# Patient Record
Sex: Female | Born: 1945 | Hispanic: No | Marital: Married | Smoking: Never smoker
Health system: Southern US, Community
[De-identification: ages and names within clinical notes are randomized; demographics above are authoritative.]

## PROBLEM LIST (undated history)

## (undated) DIAGNOSIS — F419 Anxiety disorder, unspecified: Secondary | ICD-10-CM

## (undated) DIAGNOSIS — E7800 Pure hypercholesterolemia, unspecified: Secondary | ICD-10-CM

## (undated) DIAGNOSIS — A159 Respiratory tuberculosis unspecified: Secondary | ICD-10-CM

## (undated) DIAGNOSIS — M1990 Unspecified osteoarthritis, unspecified site: Secondary | ICD-10-CM

## (undated) DIAGNOSIS — R7303 Prediabetes: Secondary | ICD-10-CM

## (undated) DIAGNOSIS — Z9889 Other specified postprocedural states: Secondary | ICD-10-CM

## (undated) DIAGNOSIS — I10 Essential (primary) hypertension: Secondary | ICD-10-CM

## (undated) DIAGNOSIS — G4730 Sleep apnea, unspecified: Secondary | ICD-10-CM

## (undated) DIAGNOSIS — D649 Anemia, unspecified: Secondary | ICD-10-CM

## (undated) HISTORY — PX: OTHER SURGICAL HISTORY: SHX169

## (undated) HISTORY — PX: BREAST CYST ASPIRATION: SHX578

## (undated) HISTORY — PX: EYE SURGERY: SHX253

---

## 1999-07-24 ENCOUNTER — Encounter: Admission: RE | Admit: 1999-07-24 | Discharge: 1999-07-24 | Payer: Self-pay | Admitting: Otolaryngology

## 1999-07-24 ENCOUNTER — Encounter: Payer: Self-pay | Admitting: Otolaryngology

## 1999-08-01 ENCOUNTER — Other Ambulatory Visit: Admission: RE | Admit: 1999-08-01 | Discharge: 1999-08-01 | Payer: Self-pay | Admitting: Otolaryngology

## 1999-08-22 ENCOUNTER — Ambulatory Visit (HOSPITAL_BASED_OUTPATIENT_CLINIC_OR_DEPARTMENT_OTHER): Admission: RE | Admit: 1999-08-22 | Discharge: 1999-08-22 | Payer: Self-pay | Admitting: Otolaryngology

## 1999-08-28 ENCOUNTER — Ambulatory Visit: Admission: RE | Admit: 1999-08-28 | Discharge: 1999-08-28 | Payer: Self-pay | Admitting: Otolaryngology

## 1999-08-30 ENCOUNTER — Encounter: Admission: RE | Admit: 1999-08-30 | Discharge: 1999-08-30 | Payer: Self-pay | Admitting: Otolaryngology

## 1999-08-30 ENCOUNTER — Encounter: Payer: Self-pay | Admitting: Otolaryngology

## 2000-12-04 ENCOUNTER — Other Ambulatory Visit: Admission: RE | Admit: 2000-12-04 | Discharge: 2000-12-04 | Payer: Self-pay | Admitting: Family Medicine

## 2003-07-27 ENCOUNTER — Encounter: Admission: RE | Admit: 2003-07-27 | Discharge: 2003-07-27 | Payer: Self-pay | Admitting: Family Medicine

## 2005-04-24 ENCOUNTER — Other Ambulatory Visit: Admission: RE | Admit: 2005-04-24 | Discharge: 2005-04-24 | Payer: Self-pay | Admitting: Family Medicine

## 2012-02-19 ENCOUNTER — Other Ambulatory Visit: Payer: Self-pay | Admitting: Family Medicine

## 2012-02-19 DIAGNOSIS — Z1231 Encounter for screening mammogram for malignant neoplasm of breast: Secondary | ICD-10-CM

## 2012-03-05 ENCOUNTER — Other Ambulatory Visit (HOSPITAL_COMMUNITY)
Admission: RE | Admit: 2012-03-05 | Discharge: 2012-03-05 | Disposition: A | Discharge status: Home / Self Care | Payer: Medicare Other | Source: Ambulatory Visit | Attending: Family Medicine | Admitting: Family Medicine

## 2012-03-05 ENCOUNTER — Other Ambulatory Visit: Payer: Self-pay | Admitting: Family Medicine

## 2012-03-05 DIAGNOSIS — Z1151 Encounter for screening for human papillomavirus (HPV): Secondary | ICD-10-CM | POA: Insufficient documentation

## 2012-03-05 DIAGNOSIS — Z124 Encounter for screening for malignant neoplasm of cervix: Secondary | ICD-10-CM | POA: Insufficient documentation

## 2012-03-27 ENCOUNTER — Emergency Department (HOSPITAL_BASED_OUTPATIENT_CLINIC_OR_DEPARTMENT_OTHER): Payer: Medicare Other

## 2012-03-27 ENCOUNTER — Encounter (HOSPITAL_BASED_OUTPATIENT_CLINIC_OR_DEPARTMENT_OTHER): Payer: Self-pay | Admitting: *Deleted

## 2012-03-27 ENCOUNTER — Emergency Department (HOSPITAL_BASED_OUTPATIENT_CLINIC_OR_DEPARTMENT_OTHER)
Admission: EM | Admit: 2012-03-27 | Discharge: 2012-03-28 | Disposition: A | Discharge status: Home / Self Care | Payer: Medicare Other | Attending: Emergency Medicine | Admitting: Emergency Medicine

## 2012-03-27 DIAGNOSIS — Y9269 Other specified industrial and construction area as the place of occurrence of the external cause: Secondary | ICD-10-CM | POA: Insufficient documentation

## 2012-03-27 DIAGNOSIS — W19XXXA Unspecified fall, initial encounter: Secondary | ICD-10-CM

## 2012-03-27 DIAGNOSIS — E7800 Pure hypercholesterolemia, unspecified: Secondary | ICD-10-CM | POA: Insufficient documentation

## 2012-03-27 DIAGNOSIS — IMO0002 Reserved for concepts with insufficient information to code with codable children: Secondary | ICD-10-CM | POA: Insufficient documentation

## 2012-03-27 DIAGNOSIS — M25462 Effusion, left knee: Secondary | ICD-10-CM

## 2012-03-27 DIAGNOSIS — W010XXA Fall on same level from slipping, tripping and stumbling without subsequent striking against object, initial encounter: Secondary | ICD-10-CM | POA: Insufficient documentation

## 2012-03-27 DIAGNOSIS — S8391XA Sprain of unspecified site of right knee, initial encounter: Secondary | ICD-10-CM

## 2012-03-27 DIAGNOSIS — I10 Essential (primary) hypertension: Secondary | ICD-10-CM | POA: Insufficient documentation

## 2012-03-27 DIAGNOSIS — S59909A Unspecified injury of unspecified elbow, initial encounter: Secondary | ICD-10-CM | POA: Insufficient documentation

## 2012-03-27 DIAGNOSIS — M25522 Pain in left elbow: Secondary | ICD-10-CM

## 2012-03-27 DIAGNOSIS — Y990 Civilian activity done for income or pay: Secondary | ICD-10-CM | POA: Insufficient documentation

## 2012-03-27 DIAGNOSIS — S6990XA Unspecified injury of unspecified wrist, hand and finger(s), initial encounter: Secondary | ICD-10-CM | POA: Insufficient documentation

## 2012-03-27 HISTORY — DX: Pure hypercholesterolemia, unspecified: E78.00

## 2012-03-27 HISTORY — DX: Essential (primary) hypertension: I10

## 2012-03-27 MED ORDER — OXYCODONE-ACETAMINOPHEN 5-325 MG PO TABS: 1.0000 | ORAL_TABLET | ORAL | Status: DC | PRN

## 2012-03-27 NOTE — ED Provider Notes (Signed)
History     CSN: [id number]  Arrival date & time 03/27/12  2109   First MD Initiated Contact with Patient 03/27/12 2115      Chief Complaint  Patient presents with  . Fall     Patient is a 66 y.o. female presenting with fall. The history is provided by the patient.  Fall The accident occurred 1 to 2 hours ago. The fall occurred while walking (she was at work and slipped on wet floor). The point of impact was the head, left knee, right knee and left elbow. The pain is present in the left knee, right knee and left elbow. The pain is mild. She was ambulatory at the scene. Pertinent negatives include no visual change, no fever, no numbness, no abdominal pain, no nausea, no vomiting, no headaches and no loss of consciousness. Exacerbated by: palpation. She has tried rest for the symptoms. The treatment provided mild relief.  Pt slipped at work and fell No LOC She hit head but no vomiting, no HA.  No weakness or visual disturbance is reported No cp/sob No abd pain She reports pain in both knees and left elbow  Past Medical History  Diagnosis Date  . Hypertension   . High cholesterol     Past Surgical History  Procedure Date  . C sections     No family history on file.  History  Substance Use Topics  . Smoking status: Never Smoker   . Smokeless tobacco: Not on file  . Alcohol Use: No    OB History    Grav Para Term Preterm Abortions TAB SAB Ect Mult Living                  Review of Systems  Constitutional: Negative for fever.  Respiratory: Negative for shortness of breath.   Cardiovascular: Negative for chest pain.  Gastrointestinal: Negative for nausea, vomiting and abdominal pain.  Neurological: Negative for loss of consciousness, weakness, numbness and headaches.  Psychiatric/Behavioral: Negative for agitation.    Allergies  Morphine and related  Home Medications  No current outpatient prescriptions on file.  BP 170/103  Pulse 112  Temp 98.7 F (37.1  C) (Oral)  Resp 20  SpO2 96%  Physical Exam  CONSTITUTIONAL: Well developed/well nourished HEAD AND FACE: Normocephalic/atraumatic EYES: EOMI/PERRL ENMT: Mucous membranes moist, No evidence of facial/nasal trauma NECK: supple no meningeal signs SPINE:entire spine nontender, NEXUS criteria met CV: S1/S2 noted, no murmurs/rubs/gallops noted Chest - nontender, no bruising noted LUNGS: Lungs are clear to auscultation bilaterally, no apparent distress ABDOMEN: soft, nontender, no rebound or guarding GU:no cva tenderness NEURO: Pt is awake/alert, moves all extremitiesx4, GCS 15 EXTREMITIES: pulses normal, full ROM.  Tenderness/swelling to left patella.  Tenderness to right patella.  No deformity to lower extremities.  She has tenderness to palpation and ROM of left elbow.  No deformity.  All other extremities/joints palpated/ranged and nontender. No injury to hands/wrists SKIN: warm, color normal PSYCH: no abnormalities of mood noted    ED Course  Procedures (including critical care time)  Labs Reviewed - No data to display Dg Elbow Complete Left  03/27/2012  *RADIOLOGY REPORT*  Clinical Data: Status post fall; posterior left elbow pain.  LEFT ELBOW - COMPLETE 3+ VIEW  Comparison: None.  Findings: There is no evidence of fracture or dislocation.  The visualized joint spaces are preserved.  No significant joint effusion is identified.  The soft tissues are unremarkable in appearance.  IMPRESSION: No evidence of fracture or dislocation.  Original Report Authenticated By: Tonia Ghent, M.D.    Dg Knee Complete 4 Views Left  03/27/2012  *RADIOLOGY REPORT*  Clinical Data: Status post fall; anterior left knee pain and swelling.  LEFT KNEE - COMPLETE 4+ VIEW  Comparison: None.  Findings: There is no evidence of fracture or dislocation.  The joint spaces are preserved.  Small marginal osteophytes are noted arising at the medial and patellofemoral compartments.  A small fabella is seen.  No  significant joint effusion is seen.  Prominent prepatellar soft tissue swelling is noted; this may reflect soft tissue injury, or prepatellar bursitis.  IMPRESSION:  1.  No evidence of fracture or dislocation. 2.  Mild osteoarthritis noted. 3.  Prominent prepatellar soft tissue swelling may reflect soft tissue injury or possibly prepatellar bursitis.   Original Report Authenticated By: Tonia Ghent, M.D.     Ace wrap provided by nursing Pt well appearing.  She can ambulate.  Stable for d/c     MDM  Nursing notes including past medical history and social history reviewed and considered in documentation xrays reviewed and considered         Joya Gaskins, MD 03/27/12 2354

## 2012-03-27 NOTE — ED Notes (Signed)
Fall. Slipped on a wet floor. Pain in her right knee. Swelling and deformity in her left knee. Pain her left elbow and left wrist. She hit her right ear but has not pain at this time.

## 2012-03-27 NOTE — ED Notes (Signed)
Patient transported to X-ray 

## 2012-03-28 ENCOUNTER — Ambulatory Visit
Admission: RE | Admit: 2012-03-28 | Discharge: 2012-03-28 | Disposition: A | Discharge status: Home / Self Care | Payer: Medicare Other | Source: Ambulatory Visit | Attending: Family Medicine | Admitting: Family Medicine

## 2012-03-28 DIAGNOSIS — Z1231 Encounter for screening mammogram for malignant neoplasm of breast: Secondary | ICD-10-CM

## 2012-04-01 ENCOUNTER — Encounter: Payer: Self-pay | Admitting: Family Medicine

## 2012-04-01 ENCOUNTER — Ambulatory Visit (INDEPENDENT_AMBULATORY_CARE_PROVIDER_SITE_OTHER): Payer: Medicare Other | Admitting: Family Medicine

## 2012-04-01 VITALS — BP 185/114 | HR 105 | Ht 61.0 in | Wt 178.0 lb

## 2012-04-01 DIAGNOSIS — S8990XA Unspecified injury of unspecified lower leg, initial encounter: Secondary | ICD-10-CM

## 2012-04-01 DIAGNOSIS — S8991XA Unspecified injury of right lower leg, initial encounter: Secondary | ICD-10-CM

## 2012-04-01 DIAGNOSIS — S8992XA Unspecified injury of left lower leg, initial encounter: Secondary | ICD-10-CM

## 2012-04-01 DIAGNOSIS — M25569 Pain in unspecified knee: Secondary | ICD-10-CM

## 2012-04-01 DIAGNOSIS — S99919A Unspecified injury of unspecified ankle, initial encounter: Secondary | ICD-10-CM

## 2012-04-01 DIAGNOSIS — M25561 Pain in right knee: Secondary | ICD-10-CM

## 2012-04-01 DIAGNOSIS — M25529 Pain in unspecified elbow: Secondary | ICD-10-CM

## 2012-04-01 MED ORDER — OXYCODONE-ACETAMINOPHEN 5-325 MG PO TABS: 1.0000 | ORAL_TABLET | Freq: Four times a day (QID) | ORAL | Status: DC | PRN

## 2012-04-01 MED ORDER — MELOXICAM 15 MG PO TABS: 15.0000 mg | ORAL_TABLET | Freq: Every day | ORAL | Status: DC

## 2012-04-01 NOTE — Patient Instructions (Addendum)
You suffered several contusions from the fall. The one area of concern is your right knee - you do have moderate underlying arthritis but may also have a meniscus tear as well. Start with meloxicam 15mg  daily with food for pain and inflammation. Icing 15 minutes at a time 3-4 times a day. ACE wrap right knee to help with swelling. Elevate above the level of your heart as much as possible. Follow up with me in 2 1/2 weeks. Consider cortisone injection, MRI if not improving as expected.

## 2012-04-03 ENCOUNTER — Encounter: Payer: Self-pay | Admitting: Family Medicine

## 2012-04-03 DIAGNOSIS — S8992XA Unspecified injury of left lower leg, initial encounter: Secondary | ICD-10-CM | POA: Insufficient documentation

## 2012-04-03 DIAGNOSIS — M25529 Pain in unspecified elbow: Secondary | ICD-10-CM | POA: Insufficient documentation

## 2012-04-03 DIAGNOSIS — S8991XA Unspecified injury of right lower leg, initial encounter: Secondary | ICD-10-CM | POA: Insufficient documentation

## 2012-04-03 NOTE — Progress Notes (Signed)
Subjective:    Patient ID: Tamara Odom, female    DOB: July 09, 1945, 66 y.o.   MRN: [id number]  PCP: [facility]  HPI 66 yo F here for bilateral knee, left elbow injuries.  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 On 12/5 she stepped on a wet spot on a floor while cleaning and slipped, landed directly on left knee, left elbow, and left wrist.   Believes she may have turned right knee. Also hit her head. Went to ED where she had radiographs - no fractures.  Mod arthritis right knee, mild left knee. Has been taking oxycodone, icing, and using ace wrap. Has been out of work since the injury. Left elbow has improved as well as head, right elbow (had some pain here).  Left wrist was sore but much improved now. Bruising to anterior left knee with some radiation up leg. Right knee is most painful currently - has prior h/o aspiration of this knee and known arthritis.  Past Medical History  Diagnosis Date  . Hypertension   . High cholesterol     Current Outpatient Prescriptions on File Prior to Visit  Medication Sig Dispense Refill  . CRESTOR 10 MG tablet       . hydrochlorothiazide (HYDRODIURIL) 25 MG tablet         Past Surgical History  Procedure Date  . C sections     Allergies  Allergen Reactions  . Morphine And Related     unknown    History   Social History  . Marital Status: Married    Spouse Name: N/A    Number of Children: N/A  . Years of Education: N/A   Occupational History  . Not on file.   Social History Main Topics  . Smoking status: Never Smoker   . Smokeless tobacco: Not on file  . Alcohol Use: No  . Drug Use: No  . Sexually Active: Not on file   Other Topics Concern  . Not on file   Social History Narrative  . No narrative on file    Family History  Problem Relation Age of Onset  . Heart attack Father   . Hypertension Father   . Diabetes Sister   . Hypertension Sister   . Hyperlipidemia Neg Hx    . Sudden death Neg Hx     BP 185/114  Pulse 105  Ht 5\' 1"  (1.549 m)  Wt 178 lb (80.74 kg)  BMI 33.63 kg/m2  Review of Systems See HPI above.    Objective:   Physical Exam Gen: NAD  Bilateral elbows: No gross deformity, swelling, bruising. No focal TTP. FROM. Collateral ligaments intact. Strength 5/5 flexion and extension.  L knee: Bruising overlying kneecap and inferior to this.  No other deformity, no effusion. TTP over bruised area.  No joint line or post patellar facet TTP. FROM. Negative ant/post drawers. Negative valgus/varus testing. Negative lachmanns. Negative mcmurrays, apleys, patellar apprehension, clarkes. NV intact distally.  R knee: Mild effusion.  No other deformity, bruising. TTP medial > lateral joint lines.  No post patellar or other TTP. ROM 0 - 120 degrees. Negative ant/post drawers. Negative valgus/varus testing. Negative lachmanns. Positive mcmurrays, apleys more medial than lateral. Negative patellar apprehension, clarkes. NV intact distally.     Assessment & Plan:  1. Right knee injury - of all injured areas, this is most painful and has persisted instead of improving.  Has underlying moderate DJD - believe she either flared this up or  caused a meniscal tear.  Will try to treat conservatively - would like to avoid injection for now.  Icing, meloxicam, ACE wrap, elevation.  F/u in 2 1/2 weeks.  Consider injection if not improving.  MRI would be a secondary consideration.  2. Elbow, left knee contusions - reassured.  Exams benign.  Meloxicam, icing as needed.  Should improve completely over next 2-3 weeks.

## 2012-04-03 NOTE — Assessment & Plan Note (Signed)
of all injured areas, this is most painful and has persisted instead of improving.  Has underlying moderate DJD - believe she either flared this up or caused a meniscal tear.  Will try to treat conservatively - would like to avoid injection for now.  Icing, meloxicam, ACE wrap, elevation.  F/u in 2 1/2 weeks.  Consider injection if not improving.  MRI would be a secondary consideration.

## 2012-04-03 NOTE — Assessment & Plan Note (Signed)
Contusion - reassured.  Exam benign.  Meloxicam, icing as needed.  Should improve completely over next 2-3 weeks. 

## 2012-04-03 NOTE — Assessment & Plan Note (Signed)
Contusion - reassured.  Exam benign.  Meloxicam, icing as needed.  Should improve completely over next 2-3 weeks.

## 2012-07-08 ENCOUNTER — Emergency Department (HOSPITAL_COMMUNITY)
Admission: EM | Admit: 2012-07-08 | Discharge: 2012-07-08 | Disposition: A | Discharge status: Home / Self Care | Payer: Medicare Other | Attending: Emergency Medicine | Admitting: Emergency Medicine

## 2012-07-08 DIAGNOSIS — R51 Headache: Secondary | ICD-10-CM | POA: Insufficient documentation

## 2012-07-08 DIAGNOSIS — IMO0002 Reserved for concepts with insufficient information to code with codable children: Secondary | ICD-10-CM | POA: Insufficient documentation

## 2012-07-08 DIAGNOSIS — Y9389 Activity, other specified: Secondary | ICD-10-CM | POA: Insufficient documentation

## 2012-07-08 DIAGNOSIS — Z862 Personal history of diseases of the blood and blood-forming organs and certain disorders involving the immune mechanism: Secondary | ICD-10-CM | POA: Insufficient documentation

## 2012-07-08 DIAGNOSIS — Z8639 Personal history of other endocrine, nutritional and metabolic disease: Secondary | ICD-10-CM | POA: Insufficient documentation

## 2012-07-08 DIAGNOSIS — I10 Essential (primary) hypertension: Secondary | ICD-10-CM | POA: Insufficient documentation

## 2012-07-08 DIAGNOSIS — S39012A Strain of muscle, fascia and tendon of lower back, initial encounter: Secondary | ICD-10-CM

## 2012-07-08 DIAGNOSIS — Z79899 Other long term (current) drug therapy: Secondary | ICD-10-CM | POA: Insufficient documentation

## 2012-07-08 MED ORDER — CYCLOBENZAPRINE HCL 10 MG PO TABS: 10.0000 mg | ORAL_TABLET | Freq: Two times a day (BID) | ORAL | Status: DC | PRN
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
## 2012-07-10 NOTE — ED Provider Notes (Signed)
  Medical screening examination/treatment/procedure(s) were performed by non-physician practitioner and as supervising physician I was immediately available for consultation/collaboration.    Gerhard Munch, MD 07/10/12 2337

## 2012-11-26 ENCOUNTER — Other Ambulatory Visit: Payer: Self-pay

## 2013-02-26 ENCOUNTER — Other Ambulatory Visit: Payer: Self-pay

## 2014-08-06 ENCOUNTER — Other Ambulatory Visit: Payer: Self-pay

## 2014-08-06 DIAGNOSIS — Z1231 Encounter for screening mammogram for malignant neoplasm of breast: Secondary | ICD-10-CM

## 2014-08-11 ENCOUNTER — Ambulatory Visit: Payer: Self-pay

## 2014-08-24 ENCOUNTER — Other Ambulatory Visit: Payer: Self-pay

## 2014-08-24 ENCOUNTER — Ambulatory Visit
Admission: RE | Admit: 2014-08-24 | Discharge: 2014-08-24 | Disposition: A | Discharge status: Home / Self Care | Payer: Medicare Other | Source: Ambulatory Visit

## 2014-08-24 DIAGNOSIS — Z1231 Encounter for screening mammogram for malignant neoplasm of breast: Secondary | ICD-10-CM

## 2015-10-19 ENCOUNTER — Other Ambulatory Visit: Payer: Self-pay | Admitting: Family Medicine

## 2015-10-19 DIAGNOSIS — Z1231 Encounter for screening mammogram for malignant neoplasm of breast: Secondary | ICD-10-CM

## 2015-11-15 ENCOUNTER — Ambulatory Visit
Admission: RE | Admit: 2015-11-15 | Discharge: 2015-11-15 | Disposition: A | Discharge status: Home / Self Care | Payer: Medicare Other | Source: Ambulatory Visit | Attending: Family Medicine | Admitting: Family Medicine

## 2015-11-15 DIAGNOSIS — Z1231 Encounter for screening mammogram for malignant neoplasm of breast: Secondary | ICD-10-CM

## 2017-02-20 ENCOUNTER — Other Ambulatory Visit: Payer: Self-pay | Admitting: Family Medicine

## 2017-02-20 DIAGNOSIS — Z1231 Encounter for screening mammogram for malignant neoplasm of breast: Secondary | ICD-10-CM

## 2017-03-20 ENCOUNTER — Ambulatory Visit
Admission: RE | Admit: 2017-03-20 | Discharge: 2017-03-20 | Disposition: A | Discharge status: Home / Self Care | Payer: Medicare Other | Source: Ambulatory Visit | Attending: Family Medicine | Admitting: Family Medicine

## 2017-03-20 DIAGNOSIS — Z1231 Encounter for screening mammogram for malignant neoplasm of breast: Secondary | ICD-10-CM

## 2017-11-25 ENCOUNTER — Encounter (HOSPITAL_COMMUNITY): Payer: Self-pay | Admitting: Emergency Medicine

## 2017-11-25 ENCOUNTER — Emergency Department (HOSPITAL_COMMUNITY): Payer: Medicare Other

## 2017-11-25 ENCOUNTER — Other Ambulatory Visit: Payer: Self-pay

## 2017-11-25 ENCOUNTER — Emergency Department (HOSPITAL_COMMUNITY)
Admission: EM | Admit: 2017-11-25 | Discharge: 2017-11-26 | Disposition: A | Discharge status: Home / Self Care | Payer: Medicare Other | Attending: Emergency Medicine | Admitting: Emergency Medicine

## 2017-11-25 DIAGNOSIS — Y999 Unspecified external cause status: Secondary | ICD-10-CM | POA: Diagnosis not present

## 2017-11-25 DIAGNOSIS — Y929 Unspecified place or not applicable: Secondary | ICD-10-CM | POA: Diagnosis not present

## 2017-11-25 DIAGNOSIS — I10 Essential (primary) hypertension: Secondary | ICD-10-CM | POA: Insufficient documentation

## 2017-11-25 DIAGNOSIS — S29012A Strain of muscle and tendon of back wall of thorax, initial encounter: Secondary | ICD-10-CM | POA: Insufficient documentation

## 2017-11-25 DIAGNOSIS — Z79899 Other long term (current) drug therapy: Secondary | ICD-10-CM | POA: Insufficient documentation

## 2017-11-25 DIAGNOSIS — X58XXXA Exposure to other specified factors, initial encounter: Secondary | ICD-10-CM | POA: Diagnosis not present

## 2017-11-25 DIAGNOSIS — Y939 Activity, unspecified: Secondary | ICD-10-CM | POA: Insufficient documentation

## 2017-11-25 DIAGNOSIS — T148XXA Other injury of unspecified body region, initial encounter: Secondary | ICD-10-CM

## 2017-11-25 LAB — CBC
HCT: 48.6 % — ABNORMAL HIGH (ref 36.0–46.0)
Hemoglobin: 15.6 g/dL — ABNORMAL HIGH (ref 12.0–15.0)
MCH: 30.5 pg (ref 26.0–34.0)
MCHC: 32.1 g/dL (ref 30.0–36.0)
MCV: 95.1 fL (ref 78.0–100.0)
PLATELETS: 253 10*3/uL (ref 150–400)
RBC: 5.11 MIL/uL (ref 3.87–5.11)
RDW: 13.2 % (ref 11.5–15.5)
WBC: 6.8 10*3/uL (ref 4.0–10.5)

## 2017-11-25 LAB — BASIC METABOLIC PANEL
Anion gap: 8 (ref 5–15)
BUN: 9 mg/dL (ref 8–23)
CALCIUM: 9.8 mg/dL (ref 8.9–10.3)
CO2: 28 mmol/L (ref 22–32)
Chloride: 104 mmol/L (ref 98–111)
Creatinine, Ser: 0.6 mg/dL (ref 0.44–1.00)
Glucose, Bld: 108 mg/dL — ABNORMAL HIGH (ref 70–99)
Potassium: 3.7 mmol/L (ref 3.5–5.1)
SODIUM: 140 mmol/L (ref 135–145)

## 2017-11-25 LAB — I-STAT TROPONIN, ED: TROPONIN I, POC: 0 ng/mL (ref 0.00–0.08)

## 2017-11-25 NOTE — ED Provider Notes (Signed)
Patient placed in Quick Look pathway, seen and evaluated   Chief Complaint: upper back pain   HPI:   Tamara Odom is a 72 y.o. female who presents to the ED with back pain that started yesterday and has gotten worse today. Pain is located in the upper back and radiates to the right arm and the right lower back.  ROS: Neuro: back pain, right arm pain, headache    Physical Exam:  BP (!) 172/112 (BP Location: Right Arm)   Pulse 99   Temp 98.5 F (36.9 C) (Oral)   Resp 18   SpO2 100%    Gen: No distress, patient appears comfortable  Neuro: Awake and Alert, grips are equal, no pronator drift  Skin: Warm and dry  M/S: tender with palpation of the thoracic spine     Initiation of care has begun. The patient has been counseled on the process, plan, and necessity for staying for the completion/evaluation, and the remainder of the medical screening examination    Janne Napoleoneese, Jazira Maloney M, NP 11/25/17 Thyra [id number]    Nanavati, Ankit, MD 11/27/17 [phone]

## 2017-11-25 NOTE — ED Triage Notes (Signed)
Pt sent by PCP, c/o back pain x 1 day. Denies chest pain/shortness of breath.

## 2017-11-26 ENCOUNTER — Emergency Department (HOSPITAL_COMMUNITY): Payer: Medicare Other

## 2017-11-26 ENCOUNTER — Encounter (HOSPITAL_COMMUNITY): Payer: Self-pay | Admitting: Emergency Medicine

## 2017-11-26 LAB — I-STAT TROPONIN, ED: Troponin i, poc: 0 ng/mL (ref 0.00–0.08)

## 2017-11-26 MED ORDER — IOPAMIDOL (ISOVUE-370) INJECTION 76%
INTRAVENOUS | Status: AC
  Filled 2017-11-26: qty 100

## 2017-11-26 MED ORDER — METHOCARBAMOL 500 MG PO TABS
1000.0000 mg | ORAL_TABLET | Freq: Once | ORAL | Status: AC
  Administered 2017-11-26: 1000 mg via ORAL
  Filled 2017-11-26: qty 2

## 2017-11-26 MED ORDER — KETOROLAC TROMETHAMINE 30 MG/ML IJ SOLN
30.0000 mg | Freq: Once | INTRAMUSCULAR | Status: AC
  Administered 2017-11-26: 30 mg via INTRAVENOUS
  Filled 2017-11-26: qty 1

## 2017-11-26 MED ORDER — METHOCARBAMOL 500 MG PO TABS: 500.0000 mg | ORAL_TABLET | Freq: Two times a day (BID) | ORAL | 0 refills | Status: DC

## 2017-11-26 MED ORDER — DICLOFENAC SODIUM ER 100 MG PO TB24: 100.0000 mg | ORAL_TABLET | Freq: Every day | ORAL | 0 refills | Status: DC

## 2017-11-26 MED ORDER — IOHEXOL 300 MG/ML  SOLN
100.0000 mL | Freq: Once | INTRAMUSCULAR | Status: AC | PRN
  Administered 2017-11-26: 100 mL via INTRAVENOUS

## 2017-11-26 NOTE — ED Notes (Signed)
Patient transported to CT 

## 2017-11-26 NOTE — ED Notes (Signed)
Patient in X-ray. Will transport to room when finished.

## 2017-11-26 NOTE — ED Notes (Signed)
Patient verbalizes understanding of medications and discharge instructions. No further questions at this time. VSS and patient ambulatory at discharge.   

## 2017-11-26 NOTE — ED Provider Notes (Signed)
[facility] EMERGENCY DEPARTMENT Provider Note   CSN: [id number] Arrival date & time: 11/25/17  1809     History   Chief Complaint Chief Complaint  Patient presents with  . Back Pain    HPI Tamara Odom is a 72 y.o. female.  The history is provided by the patient.  Back Pain   This is a new problem. The current episode started 2 days ago. The problem occurs constantly. The problem has not changed since onset.The pain is associated with no known injury. The pain is present in the thoracic spine. The quality of the pain is described as stabbing. The pain does not radiate. The pain is severe. The symptoms are aggravated by bending. The pain is the same all the time. Pertinent negatives include no chest pain, no fever, no numbness, no weight loss, no headaches, no abdominal pain, no abdominal swelling, no bowel incontinence, no perianal numbness, no bladder incontinence, no dysuria, no pelvic pain, no leg pain, no paresthesias, no paresis, no tingling and no weakness. She has tried nothing for the symptoms. The treatment provided no relief. Risk factors include obesity and menopause.  Upper back pain between shoulders.  No weakness or numbness.  No trauma.  No f/c/r.    Past Medical History:  Diagnosis Date  . High cholesterol   . Hypertension     Patient Active Problem List   Diagnosis Date Noted  . Right knee injury 04/03/2012  . Left knee injury 04/03/2012  . Elbow pain 04/03/2012    Past Surgical History:  Procedure Laterality Date  . c sections       OB History   None      Home Medications    Prior to Admission medications   Medication Sig Start Date End Date Taking? Authorizing Provider  cyclobenzaprine (FLEXERIL) 10 MG tablet Take 1 tablet (10 mg total) by mouth 2 (two) times daily as needed for muscle spasms. 07/08/12   Antony Madura, PA-C  lisinopril (PRINIVIL,ZESTRIL) 10 MG tablet Take 10 mg by mouth daily.    [provider]     Family History Family History  Problem Relation Age of Onset  . Heart attack Father   . Hypertension Father   . Diabetes Sister   . Hypertension Sister   . Hyperlipidemia Neg Hx   . Sudden death Neg Hx     Social History Social History   Tobacco Use  . Smoking status: Never Smoker  . Smokeless tobacco: Never Used  Substance Use Topics  . Alcohol use: No  . Drug use: No     Allergies   Morphine and related   Review of Systems Review of Systems  Constitutional: Negative for diaphoresis, fever and weight loss.  Respiratory: Negative for chest tightness and shortness of breath.   Cardiovascular: Negative for chest pain, palpitations and leg swelling.  Gastrointestinal: Negative for abdominal pain and bowel incontinence.  Genitourinary: Negative for bladder incontinence, dysuria and pelvic pain.  Musculoskeletal: Positive for back pain.  Neurological: Negative for tingling, weakness, numbness, headaches and paresthesias.  All other systems reviewed and are negative.    Physical Exam Updated Vital Signs BP (!) 134/91   Pulse 86   Temp 98.6 F (37 C) (Oral)   Resp 16   SpO2 96%   Physical Exam  Constitutional: She is oriented to person, place, and time. She appears well-developed and well-nourished. No distress.  HENT:  Head: Normocephalic and atraumatic.  Mouth/Throat: No oropharyngeal exudate.  Eyes: Pupils are equal, round, and reactive to light. Conjunctivae are normal.  Neck: Normal range of motion. Neck supple.  Cardiovascular: Normal rate, regular rhythm, normal heart sounds and intact distal pulses.  Pulmonary/Chest: Effort normal and breath sounds normal. No stridor. She has no wheezes. She has no rales.  Abdominal: Soft. Bowel sounds are normal. She exhibits no mass. There is no tenderness. There is no rebound and no guarding.  Musculoskeletal: Normal range of motion. She exhibits no edema, tenderness or deformity.       Cervical back: Normal.        Thoracic back: Normal.       Lumbar back: Normal. She exhibits no bony tenderness.       Back:  Neurological: She is alert and oriented to person, place, and time. She displays normal reflexes. No sensory deficit. She exhibits normal muscle tone. Coordination normal.  Skin: Skin is warm and dry. Capillary refill takes less than 2 seconds.  Psychiatric: She has a normal mood and affect.  Nursing note and vitals reviewed.    ED Treatments / Results  Labs (all labs ordered are listed, but only abnormal results are displayed) Results for orders placed or performed during the hospital encounter of 11/25/17  Basic metabolic panel  Result Value Ref Range   Sodium 140 135 - 145 mmol/L   Potassium 3.7 3.5 - 5.1 mmol/L   Chloride 104 98 - 111 mmol/L   CO2 28 22 - 32 mmol/L   Glucose, Bld 108 (H) 70 - 99 mg/dL   BUN 9 8 - 23 mg/dL   Creatinine, Ser 4.090.60 0.44 - 1.00 mg/dL   Calcium 9.8 8.9 - 81.110.3 mg/dL   GFR calc non Af Amer >60 >60 mL/min   GFR calc Af Amer >60 >60 mL/min   Anion gap 8 5 - 15  CBC  Result Value Ref Range   WBC 6.8 4.0 - 10.5 K/uL   RBC 5.11 3.87 - 5.11 MIL/uL   Hemoglobin 15.6 (H) 12.0 - 15.0 g/dL   HCT 91.448.6 (H) 78.236.0 - 95.646.0 %   MCV 95.1 78.0 - 100.0 fL   MCH 30.5 26.0 - 34.0 pg   MCHC 32.1 30.0 - 36.0 g/dL   RDW 21.313.2 08.611.5 - 57.815.5 %   Platelets 253 150 - 400 K/uL  I-stat troponin, ED  Result Value Ref Range   Troponin i, poc 0.00 0.00 - 0.08 ng/mL   Comment 3          [facility]roponin[facility])  Result Value Ref Range   Troponin i, poc 0.00 0.00 - 0.08 ng/mL   Comment 3           Ct Angio Chest Pe W And/or Wo Contrast  Result Date: 11/26/2017 CLINICAL DATA:  Back pain starting yesterday. Radiates to the right lower back and arm. EXAM: CT ANGIOGRAPHY CHEST WITH CONTRAST TECHNIQUE: Multidetector CT imaging of the chest was performed using the standard protocol during bolus administration of intravenous contrast. Multiplanar CT image reconstructions and MIPs  were obtained to evaluate the vascular anatomy. CONTRAST:  100mL OMNIPAQUE IOHEXOL 300 MG/ML  SOLN COMPARISON:  CXR 11/25/2017 FINDINGS: Cardiovascular: Conventional branch pattern of the great vessels without significant stenosis. Nonaneurysmal atherosclerotic aorta without dissection. Excellent opacification of the pulmonary arteries to the segmental level without acute pulmonary embolus. Heart is enlarged without pericardial effusion. No calcific atherosclerosis of the coronary arteries identified. Mediastinum/Nodes: Choose 1 Lungs/Pleura: Passive bibasilar atelectasis. No overt pulmonary edema. No  dominant mass or pulmonary consolidations. Upper Abdomen: No acute abnormality. Early pyelogram phase of enhancement involving both renal pelves and intrarenal collecting systems. No obstructive uropathy is seen. Musculoskeletal: Chronic appearing superior endplate compression of L2. Multilevel degenerative disc space narrowing of the lower thoracic spine consistent with spondylosis. Review of the MIP images confirms the above findings. IMPRESSION: 1. No acute pulmonary embolus. 2. Atherosclerotic nonaneurysmal thoracic aorta.  Cardiomegaly. 3. No active pulmonary disease. 4. Chronic appearing superior endplate compression of L2. Aortic Atherosclerosis (ICD10-I70.0). Electronically Signed   By: Tollie Eth M.D.   On: 11/26/2017 02:25    EKG EKG Interpretation  Date/Time:  Monday November 25 2017 18:36:34 EDT Ventricular Rate:  105 PR Interval:  176 QRS Duration: 70 QT Interval:  360 QTC Calculation: 475 R Axis:   52 Text Interpretation:  Sinus tachycardia Possible Left atrial enlargement Confirmed by Eryk Beavers ([id number]) on 11/26/2017 12:23:26 AM   Radiology Ct Angio Chest Pe W And/or Wo Contrast  Result Date: 11/26/2017 CLINICAL DATA:  Back pain starting yesterday. Radiates to the right lower back and arm. EXAM: CT ANGIOGRAPHY CHEST WITH CONTRAST TECHNIQUE: Multidetector CT imaging of the chest was  performed using the standard protocol during bolus administration of intravenous contrast. Multiplanar CT image reconstructions and MIPs were obtained to evaluate the vascular anatomy. CONTRAST:  OMNIPAQUE IOHEXOL 300 MG/ML  SOLN COMPARISON:  CXR 11/25/2017 FINDINGS: Cardiovascular: Conventional branch pattern of the great vessels without significant stenosis. Nonaneurysmal atherosclerotic aorta without dissection. Excellent opacification of the pulmonary arteries to the segmental level without acute pulmonary embolus. Heart is enlarged without pericardial effusion. No calcific atherosclerosis of the coronary arteries identified. Mediastinum/Nodes: Choose 1 Lungs/Pleura: Passive bibasilar atelectasis. No overt pulmonary edema. No dominant mass or pulmonary consolidations. Upper Abdomen: No acute abnormality. Early pyelogram phase of enhancement involving both renal pelves and intrarenal collecting systems. No obstructive uropathy is seen. Musculoskeletal: Chronic appearing superior endplate compression of L2. Multilevel degenerative disc space narrowing of the lower thoracic spine consistent with spondylosis. Review of the MIP images confirms the above findings. IMPRESSION: 1. No acute pulmonary embolus. 2. Atherosclerotic nonaneurysmal thoracic aorta.  Cardiomegaly. 3. No active pulmonary disease. 4. Chronic appearing superior endplate compression of L2. Aortic Atherosclerosis (ICD10-I70.0). Electronically Signed   By: Tollie Eth M.D.   On: 11/26/2017 02:25    Procedures Procedures (including critical care time)  Medications Ordered in ED Medications  iopamidol (ISOVUE-370) 76 % injection (has no administration in time range)  ketorolac (TORADOL) 30 MG/ML injection 30 mg (has no administration in time range)  methocarbamol (ROBAXIN) tablet 1,000 mg (has no administration in time range)  iohexol (OMNIPAQUE) 300 MG/ML solution 100 mL (100 mLs Intravenous Contrast Given 11/26/17 0152)       Final  Clinical Impressions(s) / ED Diagnoses   Ruled out for MI, PE and dissection in the ED.  The compression fracture is well below the level of pain.  Will treat for MSK pain.    Return for pain, numbness, changes in vision or speech, fevers >100.4 unrelieved by medication, shortness of breath, intractable vomiting, or diarrhea, abdominal pain, Inability to tolerate liquids or food, cough, altered mental status or any concerns. No signs of systemic illness or infection. The patient is nontoxic-appearing on exam and vital signs are within normal limits. Will refer to urology for microscopy hematuria as patient is asymptomatic.  I have reviewed the triage vital signs and the nursing notes. Pertinent labs &imaging results that were available during my care of  the patient were reviewed by me and considered in my medical decision making (see chart for details).  After history, exam, and medical workup I feel the patient has been appropriately medically screened and is safe for discharge home. Pertinent diagnoses were discussed with the patient. Patient was given return precautions.     Geoff Dacanay, MD 11/26/17 0981

## 2019-04-26 ENCOUNTER — Other Ambulatory Visit: Payer: Self-pay

## 2019-04-26 ENCOUNTER — Emergency Department (HOSPITAL_COMMUNITY)
Admission: EM | Admit: 2019-04-26 | Discharge: 2019-04-26 | Disposition: A | Discharge status: Home / Self Care | Payer: Medicare Other | Attending: Emergency Medicine | Admitting: Emergency Medicine

## 2019-04-26 ENCOUNTER — Encounter (HOSPITAL_COMMUNITY): Payer: Self-pay | Admitting: Emergency Medicine

## 2019-04-26 ENCOUNTER — Emergency Department (HOSPITAL_COMMUNITY): Payer: Medicare Other

## 2019-04-26 DIAGNOSIS — U071 COVID-19: Secondary | ICD-10-CM | POA: Diagnosis not present

## 2019-04-26 DIAGNOSIS — Z79899 Other long term (current) drug therapy: Secondary | ICD-10-CM | POA: Insufficient documentation

## 2019-04-26 DIAGNOSIS — I10 Essential (primary) hypertension: Secondary | ICD-10-CM | POA: Diagnosis not present

## 2019-04-26 DIAGNOSIS — E876 Hypokalemia: Secondary | ICD-10-CM | POA: Diagnosis not present

## 2019-04-26 DIAGNOSIS — R509 Fever, unspecified: Secondary | ICD-10-CM | POA: Diagnosis present

## 2019-04-26 LAB — CBC WITH DIFFERENTIAL/PLATELET
Abs Immature Granulocytes: 0.47 10*3/uL — ABNORMAL HIGH (ref 0.00–0.07)
Basophils Absolute: 0.1 10*3/uL (ref 0.0–0.1)
Basophils Relative: 1 %
Eosinophils Absolute: 0 10*3/uL (ref 0.0–0.5)
Eosinophils Relative: 0 %
HCT: 44.3 % (ref 36.0–46.0)
Hemoglobin: 14.7 g/dL (ref 12.0–15.0)
Immature Granulocytes: 4 %
Lymphocytes Relative: 16 %
Lymphs Abs: 1.8 10*3/uL (ref 0.7–4.0)
MCH: 30.9 pg (ref 26.0–34.0)
MCHC: 33.2 g/dL (ref 30.0–36.0)
MCV: 93.1 fL (ref 80.0–100.0)
Monocytes Absolute: 1.1 10*3/uL — ABNORMAL HIGH (ref 0.1–1.0)
Monocytes Relative: 10 %
Neutro Abs: 7.4 10*3/uL (ref 1.7–7.7)
Neutrophils Relative %: 69 %
Platelets: 399 10*3/uL (ref 150–400)
RBC: 4.76 MIL/uL (ref 3.87–5.11)
RDW: 13.1 % (ref 11.5–15.5)
WBC: 10.9 10*3/uL — ABNORMAL HIGH (ref 4.0–10.5)
nRBC: 0 % (ref 0.0–0.2)

## 2019-04-26 LAB — COMPREHENSIVE METABOLIC PANEL
ALT: 106 U/L — ABNORMAL HIGH (ref 0–44)
AST: 49 U/L — ABNORMAL HIGH (ref 15–41)
Albumin: 3 g/dL — ABNORMAL LOW (ref 3.5–5.0)
Alkaline Phosphatase: 78 U/L (ref 38–126)
Anion gap: 12 (ref 5–15)
BUN: 16 mg/dL (ref 8–23)
CO2: 24 mmol/L (ref 22–32)
Calcium: 9.2 mg/dL (ref 8.9–10.3)
Chloride: 102 mmol/L (ref 98–111)
Creatinine, Ser: 0.59 mg/dL (ref 0.44–1.00)
GFR calc Af Amer: >60 mL/min (ref >60)
GFR calc non Af Amer: >60 mL/min (ref >60)
Glucose, Bld: 99 mg/dL (ref 70–99)
Potassium: 3.1 mmol/L — ABNORMAL LOW (ref 3.5–5.1)
Sodium: 138 mmol/L (ref 135–145)
Total Bilirubin: 0.8 mg/dL (ref 0.3–1.2)
Total Protein: 6.5 g/dL (ref 6.5–8.1)

## 2019-04-26 LAB — LACTIC ACID, PLASMA: Lactic Acid, Venous: 1.5 mmol/L (ref 0.5–1.9)

## 2019-04-26 LAB — POC SARS CORONAVIRUS 2 AG -  ED: SARS Coronavirus 2 Ag: POSITIVE — AB

## 2019-04-26 MED ORDER — IBUPROFEN 200 MG PO TABS
600.0000 mg | ORAL_TABLET | Freq: Once | ORAL | Status: AC
  Administered 2019-04-26: 600 mg via ORAL
  Filled 2019-04-26: qty 3

## 2019-04-26 MED ORDER — SODIUM CHLORIDE 0.9 % IV BOLUS
500.0000 mL | Freq: Once | INTRAVENOUS | Status: AC
  Administered 2019-04-26: 500 mL via INTRAVENOUS

## 2019-04-26 MED ORDER — HYDROCOD POLST-CPM POLST ER 10-8 MG/5ML PO SUER: 5.0000 mL | Freq: Two times a day (BID) | ORAL | 0 refills | Status: DC | PRN

## 2019-04-26 MED ORDER — POTASSIUM CHLORIDE CRYS ER 20 MEQ PO TBCR
40.0000 meq | EXTENDED_RELEASE_TABLET | Freq: Once | ORAL | Status: AC
  Administered 2019-04-26: 40 meq via ORAL
  Filled 2019-04-26: qty 2

## 2019-04-26 MED ORDER — POTASSIUM CHLORIDE CRYS ER 20 MEQ PO TBCR: 20.0000 meq | EXTENDED_RELEASE_TABLET | Freq: Two times a day (BID) | ORAL | 0 refills | Status: DC

## 2019-04-26 MED ORDER — HYDROCOD POLST-CPM POLST ER 10-8 MG/5ML PO SUER
5.0000 mL | Freq: Once | ORAL | Status: AC
  Administered 2019-04-26: 5 mL via ORAL
  Filled 2019-04-26: qty 5

## 2019-04-26 NOTE — Discharge Instructions (Addendum)
The hospital to home Covid team will contact you today and be prescribed further medications.  Return to the emergency department if you have worsening symptoms especially shortness of breath.

## 2019-04-26 NOTE — ED Triage Notes (Signed)
Patient arrived via POV from home. Patient is AOx4 and ambulatory. Patient has been having worsening symptoms before and after being tested for COVID and testing positive 7 days ago. Patient has been experiencing fever and emesis along with constant feeling of being tired. Patient is on room air and sat'ing at 95%.

## 2019-04-26 NOTE — ED Provider Notes (Signed)
[facility]-EMERGENCY DEPT Provider Note   CSN: [id number] Arrival date & time: 04/26/19  0932     History Chief Complaint  Patient presents with  . COVID +  . Fever  . Emesis    Tamara Odom is a 74 y.o. female.  HPI 74 year old female presents today due to coughing.  She had a positive Covid test 1 week ago.  This was obtained at a [facility] in [place].  She has no copy of the results.  She thinks her husband previously had a pulse currently well was never tested.  She has had symptoms now for 2 weeks.  She was tested 1 week ago.  She has continued to have a fever over the past week.  She last took antipyretics this morning at 7 AM.  She has had some decreased p.o. intake but is continuing to drink well and states that the worst symptom she is having is that she coughs constantly and is unable to sleep.  She also urinates when she coughs.  She denies any headache, head injury, difficulty swallowing, dyspnea, abdominal pain, nausea, vomiting, or diarrhea.  She does have comorbidity of high blood pressure.    Past Medical History:  Diagnosis Date  . High cholesterol   . Hypertension     Patient Active Problem List   Diagnosis Date Noted  . Right knee injury 04/03/2012  . Left knee injury 04/03/2012  . Elbow pain 04/03/2012    Past Surgical History:  Procedure Laterality Date  . c sections       OB History   No obstetric history on file.     Family History  Problem Relation Age of Onset  . Heart attack Father   . Hypertension Father   . Diabetes Sister   . Hypertension Sister   . Hyperlipidemia Neg Hx   . Sudden death Neg Hx     Social History   Tobacco Use  . Smoking status: Never Smoker  . Smokeless tobacco: Never Used  Substance Use Topics  . Alcohol use: No  . Drug use: No    Home Medications Prior to Admission medications   Medication Sig Start Date End Date Taking? Authorizing Provider  cyclobenzaprine (FLEXERIL) 10  MG tablet Take 1 tablet (10 mg total) by mouth 2 (two) times daily as needed for muscle spasms. 07/08/12   Antony Madura, PA-C  Diclofenac Sodium CR 100 MG 24 hr tablet Take 1 tablet (100 mg total) by mouth daily. 11/26/17   Palumbo, April, MD  lisinopril (PRINIVIL,ZESTRIL) 10 MG tablet Take 10 mg by mouth daily.    [provider]  methocarbamol (ROBAXIN) 500 MG tablet Take 1 tablet (500 mg total) by mouth 2 (two) times daily. 11/26/17   Palumbo, April, MD    Allergies    Morphine and related  Review of Systems   Review of Systems  All other systems reviewed and are negative.   Physical Exam Updated Vital Signs BP (!) 161/105 (BP Location: Left Arm)   Pulse (!) 108 Comment: Simultaneous filing. User may not have seen previous data.  Temp 100.1 F (37.8 C) (Rectal)   Resp 18   Ht 1.549 m (5\' 1" )   Wt 75 kg   SpO2 94% Comment: Simultaneous filing. User may not have seen previous data.  BMI 31.24 kg/m   Physical Exam Vitals and nursing note reviewed.  Constitutional:      General: She is not in acute distress.    Appearance:  Normal appearance. She is normal weight. She is not ill-appearing.  HENT:     Head: Normocephalic.     Right Ear: External ear normal.     Left Ear: External ear normal.     Nose: Nose normal.     Mouth/Throat:     Mouth: Mucous membranes are moist.  Eyes:     Pupils: Pupils are equal, round, and reactive to light.  Cardiovascular:     Rate and Rhythm: Regular rhythm. Tachycardia present.     Pulses: Normal pulses.  Pulmonary:     Effort: Pulmonary effort is normal.     Breath sounds: Normal breath sounds.  Abdominal:     General: Abdomen is flat.  Musculoskeletal:        General: Normal range of motion.     Cervical back: Normal range of motion.  Skin:    General: Skin is warm and dry.     Capillary Refill: Capillary refill takes less than 2 seconds.  Neurological:     General: No focal deficit present.     Mental Status: She is alert.    Psychiatric:        Mood and Affect: Mood normal.     ED Results / Procedures / Treatments   Labs (all labs ordered are listed, but only abnormal results are displayed) Labs Reviewed  CBC WITH DIFFERENTIAL/PLATELET - Abnormal; Notable for the following components:      Result Value   WBC 10.9 (*)    Monocytes Absolute 1.1 (*)    Abs Immature Granulocytes 0.47 (*)    All other components within normal limits  POC SARS CORONAVIRUS 2 AG -  ED - Abnormal; Notable for the following components:   SARS Coronavirus 2 Ag POSITIVE (*)    All other components within normal limits  LACTIC ACID, PLASMA  LACTIC ACID, PLASMA  COMPREHENSIVE METABOLIC PANEL  D-DIMER, QUANTITATIVE (NOT AT [facility])  PROCALCITONIN  LACTATE DEHYDROGENASE  FERRITIN  TRIGLYCERIDES  FIBRINOGEN  C-REACTIVE PROTEIN    EKG EKG Interpretation  Date/Time:  Sunday April 26 2019 08:51:43 EST Ventricular Rate:  107 PR Interval:    QRS Duration: 77 QT Interval:  353 QTC Calculation: 471 R Axis:   36 Text Interpretation: Sinus tachycardia with irregular rate Low voltage, precordial leads Artifact in lead(s) I II III aVR aVL aVF V1 V2 V3 V4 V5 V6 No significant change since last tracing Confirmed by Margarita Grizzle [phone]) on 04/26/2019 10:36:32 AM   Radiology DG Chest Port 1 View  Result Date: 04/26/2019 CLINICAL DATA:  COVID. Additional history provided: Worsening symptoms, fever, emesis, fatigue EXAM: PORTABLE CHEST 1 VIEW COMPARISON:  Chest radiograph and CT chest 11/26/2017 FINDINGS: Heart size at the upper limits of normal and accentuated by shallow inspiration radiograph. Mild interstitial opacities within the mid to lower lung fields bilaterally. A small left pleural effusion may be present. No evidence of pneumothorax. No acute bony abnormality. Overlying cardiac monitoring leads. IMPRESSION: Shallow inspiration radiograph. Mild interstitial opacities within the mid to lower lung fields bilaterally suspicious for  atypical/viral pneumonia given provided history. A small left pleural effusion is questioned. Heart size at the upper limits of normal, unchanged. Electronically Signed   By: Jackey Loge DO   On: 04/26/2019 10:04    Procedures Procedures (including critical care time)  Medications Ordered in ED Medications  chlorpheniramine-HYDROcodone (TUSSIONEX) 10-8 MG/5ML suspension 5 mL (5 mLs Oral Given 04/26/19 0923)  sodium chloride 0.9 % bolus 500 mL (500 mLs Intravenous  New Bag/Given 04/26/19 1062)    ED Course  I have reviewed the triage vital signs and the nursing notes.  Pertinent labs & imaging results that were available during my care of the patient were reviewed by me and considered in my medical decision making (see chart for details).    MDM Rules/Calculators/A&P                      Patient appears stable for discharge.  Reviewed her CBC.  Her chemistry and lactic acid are still pending.  I have discontinued her Covid admission orders of C-reactive protein, fibrinogen, triglycerides, ferritin, LDH, procalcitonin, D-dimer Called home to health for referral for outpatient f/u Mild tachycardia which was present on prior visit precovid-suspect baseline CXR with multiple inflitrates c.w. covid  Discussed with Vertell Novak NP with [facility] and they will follow and treat.  I will give first dose steroids here in ED]  Final Clinical Impression(s) / ED Diagnoses Final diagnoses:  COVID-19  Hypokalemia    Rx / DC Orders ED Discharge Orders         Ordered    potassium chloride SA (KLOR-CON) 20 MEQ tablet  2 times daily     04/26/19 1035    chlorpheniramine-HYDROcodone (TUSSIONEX PENNKINETIC ER) 10-8 MG/5ML SUER  Every 12 hours PRN     04/26/19 1044           Pattricia Boss, MD 04/26/19 1045

## 2019-04-27 NOTE — Progress Notes (Signed)
Covid home visit on behalf of [facility].  Ms. Gauer stated she was feeling tired and c/o cough.  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  Her husband had just picked up her meds ordered by Eula Fried, DNP but she hadn't taken any yet.  She also didn't yet have a nebulizer, but [facility] will be dropping one off today and teaching will be done.  Visit details including VS have been transcribed into [employer] by Eula Fried, DNP.  Labs drawn as ordered by Marcelino Duster, DNP (CRP, CMP, CBC) and dropped off to the after hours box at [employer].Sara Lee.

## 2019-06-19 ENCOUNTER — Other Ambulatory Visit: Payer: Self-pay | Admitting: Family Medicine

## 2019-06-19 DIAGNOSIS — Z1231 Encounter for screening mammogram for malignant neoplasm of breast: Secondary | ICD-10-CM

## 2019-07-21 ENCOUNTER — Ambulatory Visit: Payer: Medicare Other

## 2020-01-08 ENCOUNTER — Other Ambulatory Visit: Payer: Self-pay | Admitting: Family Medicine

## 2020-01-20 DIAGNOSIS — M25561 Pain in right knee: Secondary | ICD-10-CM | POA: Insufficient documentation

## 2020-01-27 ENCOUNTER — Ambulatory Visit
Admission: RE | Admit: 2020-01-27 | Discharge: 2020-01-27 | Disposition: A | Discharge status: Home / Self Care | Payer: Medicare Other | Source: Ambulatory Visit | Attending: Family Medicine | Admitting: Family Medicine

## 2020-01-27 ENCOUNTER — Other Ambulatory Visit: Payer: Self-pay

## 2020-01-27 DIAGNOSIS — Z1231 Encounter for screening mammogram for malignant neoplasm of breast: Secondary | ICD-10-CM

## 2020-04-28 DIAGNOSIS — U071 COVID-19: Secondary | ICD-10-CM | POA: Diagnosis not present

## 2020-04-28 DIAGNOSIS — J22 Unspecified acute lower respiratory infection: Secondary | ICD-10-CM | POA: Diagnosis not present

## 2020-05-27 DIAGNOSIS — H35372 Puckering of macula, left eye: Secondary | ICD-10-CM | POA: Diagnosis not present

## 2020-05-27 DIAGNOSIS — H2513 Age-related nuclear cataract, bilateral: Secondary | ICD-10-CM | POA: Diagnosis not present

## 2020-05-27 DIAGNOSIS — H25013 Cortical age-related cataract, bilateral: Secondary | ICD-10-CM | POA: Diagnosis not present

## 2020-05-27 DIAGNOSIS — H2511 Age-related nuclear cataract, right eye: Secondary | ICD-10-CM | POA: Diagnosis not present

## 2020-07-06 DIAGNOSIS — H25811 Combined forms of age-related cataract, right eye: Secondary | ICD-10-CM | POA: Diagnosis not present

## 2020-07-06 DIAGNOSIS — H2511 Age-related nuclear cataract, right eye: Secondary | ICD-10-CM | POA: Diagnosis not present

## 2020-07-12 DIAGNOSIS — R6889 Other general symptoms and signs: Secondary | ICD-10-CM | POA: Diagnosis not present

## 2020-07-12 DIAGNOSIS — H353131 Nonexudative age-related macular degeneration, bilateral, early dry stage: Secondary | ICD-10-CM | POA: Diagnosis not present

## 2020-08-02 DIAGNOSIS — H25012 Cortical age-related cataract, left eye: Secondary | ICD-10-CM | POA: Diagnosis not present

## 2020-08-02 DIAGNOSIS — H2512 Age-related nuclear cataract, left eye: Secondary | ICD-10-CM | POA: Diagnosis not present

## 2020-08-17 DIAGNOSIS — J029 Acute pharyngitis, unspecified: Secondary | ICD-10-CM | POA: Diagnosis not present

## 2020-08-17 DIAGNOSIS — Z03818 Encounter for observation for suspected exposure to other biological agents ruled out: Secondary | ICD-10-CM | POA: Diagnosis not present

## 2020-08-17 DIAGNOSIS — J069 Acute upper respiratory infection, unspecified: Secondary | ICD-10-CM | POA: Diagnosis not present

## 2020-08-17 DIAGNOSIS — R059 Cough, unspecified: Secondary | ICD-10-CM | POA: Diagnosis not present

## 2020-08-17 DIAGNOSIS — R509 Fever, unspecified: Secondary | ICD-10-CM | POA: Diagnosis not present

## 2020-09-07 DIAGNOSIS — H25012 Cortical age-related cataract, left eye: Secondary | ICD-10-CM | POA: Diagnosis not present

## 2020-09-07 DIAGNOSIS — H2512 Age-related nuclear cataract, left eye: Secondary | ICD-10-CM | POA: Diagnosis not present

## 2020-09-07 DIAGNOSIS — H25812 Combined forms of age-related cataract, left eye: Secondary | ICD-10-CM | POA: Diagnosis not present

## 2020-11-02 DIAGNOSIS — Z20822 Contact with and (suspected) exposure to covid-19: Secondary | ICD-10-CM | POA: Diagnosis not present

## 2021-01-26 DIAGNOSIS — Z961 Presence of intraocular lens: Secondary | ICD-10-CM | POA: Diagnosis not present

## 2021-01-26 DIAGNOSIS — H35372 Puckering of macula, left eye: Secondary | ICD-10-CM | POA: Diagnosis not present

## 2021-01-26 DIAGNOSIS — H353131 Nonexudative age-related macular degeneration, bilateral, early dry stage: Secondary | ICD-10-CM | POA: Diagnosis not present

## 2021-01-26 DIAGNOSIS — H524 Presbyopia: Secondary | ICD-10-CM | POA: Diagnosis not present

## 2021-01-31 DIAGNOSIS — M85859 Other specified disorders of bone density and structure, unspecified thigh: Secondary | ICD-10-CM | POA: Diagnosis not present

## 2021-01-31 DIAGNOSIS — I10 Essential (primary) hypertension: Secondary | ICD-10-CM | POA: Diagnosis not present

## 2021-01-31 DIAGNOSIS — R7303 Prediabetes: Secondary | ICD-10-CM | POA: Diagnosis not present

## 2021-01-31 DIAGNOSIS — E6609 Other obesity due to excess calories: Secondary | ICD-10-CM | POA: Diagnosis not present

## 2021-01-31 DIAGNOSIS — M1711 Unilateral primary osteoarthritis, right knee: Secondary | ICD-10-CM | POA: Diagnosis not present

## 2021-01-31 DIAGNOSIS — Z0000 Encounter for general adult medical examination without abnormal findings: Secondary | ICD-10-CM | POA: Diagnosis not present

## 2021-01-31 DIAGNOSIS — E559 Vitamin D deficiency, unspecified: Secondary | ICD-10-CM | POA: Diagnosis not present

## 2021-01-31 DIAGNOSIS — Z6833 Body mass index (BMI) 33.0-33.9, adult: Secondary | ICD-10-CM | POA: Diagnosis not present

## 2021-01-31 DIAGNOSIS — E785 Hyperlipidemia, unspecified: Secondary | ICD-10-CM | POA: Diagnosis not present

## 2021-02-07 ENCOUNTER — Other Ambulatory Visit: Payer: Self-pay | Admitting: Family Medicine

## 2021-02-07 DIAGNOSIS — Z1231 Encounter for screening mammogram for malignant neoplasm of breast: Secondary | ICD-10-CM

## 2021-02-07 DIAGNOSIS — M8580 Other specified disorders of bone density and structure, unspecified site: Secondary | ICD-10-CM

## 2021-02-07 DIAGNOSIS — E785 Hyperlipidemia, unspecified: Secondary | ICD-10-CM | POA: Diagnosis not present

## 2021-02-07 DIAGNOSIS — R7303 Prediabetes: Secondary | ICD-10-CM | POA: Diagnosis not present

## 2021-02-07 DIAGNOSIS — E559 Vitamin D deficiency, unspecified: Secondary | ICD-10-CM | POA: Diagnosis not present

## 2021-03-02 ENCOUNTER — Ambulatory Visit
Admission: RE | Admit: 2021-03-02 | Discharge: 2021-03-02 | Disposition: A | Discharge status: Home / Self Care | Payer: Medicare HMO | Source: Ambulatory Visit | Attending: Family Medicine | Admitting: Family Medicine

## 2021-03-02 ENCOUNTER — Other Ambulatory Visit: Payer: Self-pay

## 2021-03-02 DIAGNOSIS — M8580 Other specified disorders of bone density and structure, unspecified site: Secondary | ICD-10-CM

## 2021-03-02 DIAGNOSIS — Z1231 Encounter for screening mammogram for malignant neoplasm of breast: Secondary | ICD-10-CM | POA: Diagnosis not present

## 2021-03-02 DIAGNOSIS — Z780 Asymptomatic menopausal state: Secondary | ICD-10-CM | POA: Diagnosis not present

## 2021-03-02 DIAGNOSIS — M8589 Other specified disorders of bone density and structure, multiple sites: Secondary | ICD-10-CM | POA: Diagnosis not present

## 2021-03-07 DIAGNOSIS — M1711 Unilateral primary osteoarthritis, right knee: Secondary | ICD-10-CM | POA: Diagnosis not present

## 2021-04-10 DIAGNOSIS — R7303 Prediabetes: Secondary | ICD-10-CM | POA: Diagnosis not present

## 2021-04-10 DIAGNOSIS — Z6833 Body mass index (BMI) 33.0-33.9, adult: Secondary | ICD-10-CM | POA: Diagnosis not present

## 2021-04-10 DIAGNOSIS — I10 Essential (primary) hypertension: Secondary | ICD-10-CM | POA: Diagnosis not present

## 2021-04-10 DIAGNOSIS — E785 Hyperlipidemia, unspecified: Secondary | ICD-10-CM | POA: Diagnosis not present

## 2021-04-28 DIAGNOSIS — M1711 Unilateral primary osteoarthritis, right knee: Secondary | ICD-10-CM | POA: Diagnosis not present

## 2021-05-05 DIAGNOSIS — M1711 Unilateral primary osteoarthritis, right knee: Secondary | ICD-10-CM | POA: Diagnosis not present

## 2021-05-12 DIAGNOSIS — M1711 Unilateral primary osteoarthritis, right knee: Secondary | ICD-10-CM | POA: Diagnosis not present

## 2021-05-27 DIAGNOSIS — Z20822 Contact with and (suspected) exposure to covid-19: Secondary | ICD-10-CM | POA: Diagnosis not present

## 2021-05-27 DIAGNOSIS — Z03818 Encounter for observation for suspected exposure to other biological agents ruled out: Secondary | ICD-10-CM | POA: Diagnosis not present

## 2021-05-30 DIAGNOSIS — J069 Acute upper respiratory infection, unspecified: Secondary | ICD-10-CM | POA: Diagnosis not present

## 2021-07-10 DIAGNOSIS — E785 Hyperlipidemia, unspecified: Secondary | ICD-10-CM | POA: Diagnosis not present

## 2021-07-10 DIAGNOSIS — R7303 Prediabetes: Secondary | ICD-10-CM | POA: Diagnosis not present

## 2021-07-17 DIAGNOSIS — I10 Essential (primary) hypertension: Secondary | ICD-10-CM | POA: Diagnosis not present

## 2021-07-17 DIAGNOSIS — E785 Hyperlipidemia, unspecified: Secondary | ICD-10-CM | POA: Diagnosis not present

## 2021-07-17 DIAGNOSIS — Z6833 Body mass index (BMI) 33.0-33.9, adult: Secondary | ICD-10-CM | POA: Diagnosis not present

## 2021-07-17 DIAGNOSIS — R7303 Prediabetes: Secondary | ICD-10-CM | POA: Diagnosis not present

## 2021-08-01 DIAGNOSIS — Z1211 Encounter for screening for malignant neoplasm of colon: Secondary | ICD-10-CM | POA: Diagnosis not present

## 2021-08-01 DIAGNOSIS — E785 Hyperlipidemia, unspecified: Secondary | ICD-10-CM | POA: Diagnosis not present

## 2021-08-01 DIAGNOSIS — M85859 Other specified disorders of bone density and structure, unspecified thigh: Secondary | ICD-10-CM | POA: Diagnosis not present

## 2021-08-01 DIAGNOSIS — E6609 Other obesity due to excess calories: Secondary | ICD-10-CM | POA: Diagnosis not present

## 2021-08-01 DIAGNOSIS — Z6833 Body mass index (BMI) 33.0-33.9, adult: Secondary | ICD-10-CM | POA: Diagnosis not present

## 2021-08-01 DIAGNOSIS — E559 Vitamin D deficiency, unspecified: Secondary | ICD-10-CM | POA: Diagnosis not present

## 2021-08-01 DIAGNOSIS — M1711 Unilateral primary osteoarthritis, right knee: Secondary | ICD-10-CM | POA: Diagnosis not present

## 2021-08-01 DIAGNOSIS — R7303 Prediabetes: Secondary | ICD-10-CM | POA: Diagnosis not present

## 2021-08-01 DIAGNOSIS — I10 Essential (primary) hypertension: Secondary | ICD-10-CM | POA: Diagnosis not present

## 2021-11-10 DIAGNOSIS — E785 Hyperlipidemia, unspecified: Secondary | ICD-10-CM | POA: Diagnosis not present

## 2021-11-29 ENCOUNTER — Telehealth: Payer: Medicare HMO | Admitting: Physician Assistant

## 2021-11-29 DIAGNOSIS — J111 Influenza due to unidentified influenza virus with other respiratory manifestations: Secondary | ICD-10-CM | POA: Diagnosis not present

## 2021-11-29 MED ORDER — BENZONATATE 100 MG PO CAPS: 100.0000 mg | ORAL_CAPSULE | Freq: Three times a day (TID) | ORAL | 0 refills | Status: DC | PRN

## 2021-11-29 MED ORDER — OSELTAMIVIR PHOSPHATE 75 MG PO CAPS: 75.0000 mg | ORAL_CAPSULE | Freq: Two times a day (BID) | ORAL | 0 refills | Status: DC

## 2021-11-29 NOTE — Patient Instructions (Signed)
Oneita Hurt, thank you for joining [name], PA-C for today's virtual visit.  While this provider is not your primary care provider (PCP), if your PCP is located in our provider database this encounter information will be shared with them immediately following your visit.  Consent: (Patient) Tamara Odom provided verbal consent for this virtual visit at the beginning of the encounter.  Current Medications:  Current Outpatient Medications:    acetaminophen (TYLENOL) 500 MG tablet, Take 250 mg by mouth daily as needed for mild pain or moderate pain., Disp: , Rfl:    Ascorbic Acid (VITAMIN C PO), Take 1 tablet by mouth daily., Disp: , Rfl:    benzonatate (TESSALON) 100 MG capsule, Take 1 capsule (100 mg total) by mouth 3 (three) times daily as needed., Disp: 30 capsule, Rfl: 0   chlorpheniramine-HYDROcodone (TUSSIONEX PENNKINETIC ER) 10-8 MG/5ML SUER, Take 5 mLs by mouth every 12 (twelve) hours as needed for cough., Disp: 140 mL, Rfl: 0   Cholecalciferol (VITAMIN D-3) 125 MCG (5000 UT) TABS, Take 5,000 mg by mouth daily., Disp: , Rfl:    Cyanocobalamin (VITAMIN B-12 PO), Take 1 tablet by mouth daily., Disp: , Rfl:    cyclobenzaprine (FLEXERIL) 10 MG tablet, Take 1 tablet (10 mg total) by mouth 2 (two) times daily as needed for muscle spasms. (Patient not taking: Reported on 04/26/2019), Disp: 20 tablet, Rfl: 0   Diclofenac Sodium CR 100 MG 24 hr tablet, Take 1 tablet (100 mg total) by mouth daily. (Patient not taking: Reported on 04/26/2019), Disp: 10 tablet, Rfl: 0   lisinopril-hydrochlorothiazide (ZESTORETIC) 20-12.5 MG tablet, Take 1 tablet by mouth daily., Disp: , Rfl:    methocarbamol (ROBAXIN) 500 MG tablet, Take 1 tablet (500 mg total) by mouth 2 (two) times daily. (Patient not taking: Reported on 04/26/2019), Disp: 20 tablet, Rfl: 0   oseltamivir (TAMIFLU) 75 MG capsule, Take 1 capsule (75 mg total) by mouth 2 (two) times daily., Disp: 10 capsule, Rfl: 0   potassium chloride SA  (KLOR-CON) 20 MEQ tablet, Take 1 tablet (20 mEq total) by mouth 2 (two) times daily., Disp: 20 tablet, Rfl: 0   predniSONE (STERAPRED UNI-PAK 21 TAB) 10 MG (21) TBPK tablet, Take 40 mg by mouth daily., Disp: , Rfl:    Medications ordered in this encounter:  Meds ordered this encounter  Medications   oseltamivir (TAMIFLU) 75 MG capsule    Sig: Take 1 capsule (75 mg total) by mouth 2 (two) times daily.    Dispense:  10 capsule    Refill:  0    Order Specific Question:   Supervising Provider    Answer:   MILLER, BRIAN [[id number]]   benzonatate (TESSALON) 100 MG capsule    Sig: Take 1 capsule (100 mg total) by mouth 3 (three) times daily as needed.    Dispense:  30 capsule    Refill:  0    Order Specific Question:   Supervising Provider    Answer:   Hyacinth Meeker, BRIAN [[id number]]     *If you need refills on other medications prior to your next appointment, please contact your pharmacy*  Follow-Up: Call back or seek an in-person evaluation if the symptoms worsen or if the condition fails to improve as anticipated.  Other Instructions Influenza, Adult Influenza, also called "the flu," is a viral infection that mainly affects the respiratory tract. This includes the lungs, nose, and throat. The flu spreads easily from person to person (is contagious). It causes common cold symptoms,  along with high fever and body aches. What are the causes? This condition is caused by the influenza virus. You can get the virus by: Breathing in droplets that are in the air from an infected person's cough or sneeze. Touching something that has the virus on it (has been contaminated) and then touching your mouth, nose, or eyes. What increases the risk? The following factors may make you more likely to get the flu: Not washing or sanitizing your hands often. Having close contact with many people during cold and flu season. Touching your mouth, eyes, or nose without first washing or sanitizing your hands. Not getting an  annual flu shot. You may have a higher risk for the flu, including serious problems, such as a lung infection (pneumonia), if you: Are older than 65. Are pregnant. Have a weakened disease-fighting system (immune system). This includes people who have HIV or AIDS, are on chemotherapy, or are taking medicines that reduce (suppress) the immune system. Have a long-term (chronic) illness, such as heart disease, kidney disease, diabetes, or lung disease. Have a liver disorder. Are severely overweight (morbidly obese). Have anemia. Have asthma. What are the signs or symptoms? Symptoms of this condition usually begin suddenly and last 4-14 days. These may include: Fever and chills. Headaches, body aches, or muscle aches. Sore throat. Cough. Runny or stuffy (congested) nose. Chest discomfort. Poor appetite. Weakness or fatigue. Dizziness. Nausea or vomiting. How is this diagnosed? This condition may be diagnosed based on: Your symptoms and medical history. A physical exam. Swabbing your nose or throat and testing the fluid for the influenza virus. How is this treated? If the flu is diagnosed early, you can be treated with antiviral medicine that is given by mouth (orally) or through an IV. This can help reduce how severe the illness is and how long it lasts. Taking care of yourself at home can help relieve symptoms. Your health care provider may recommend: Taking over-the-counter medicines. Drinking plenty of fluids. In many cases, the flu goes away on its own. If you have severe symptoms or complications, you may be treated in a hospital. Follow these instructions at home: Activity Rest as needed and get plenty of sleep. Stay home from work or school as told by your health care provider. Unless you are visiting your health care provider, avoid leaving home until your fever has been gone for 24 hours without taking medicine. Eating and drinking Take an oral rehydration solution (ORS).  This is a drink that is sold at pharmacies and retail stores. Drink enough fluid to keep your urine pale yellow. Drink clear fluids in small amounts as you are able. Clear fluids include water, ice chips, fruit juice mixed with water, and low-calorie sports drinks. Eat bland, easy-to-digest foods in small amounts as you are able. These foods include bananas, applesauce, rice, lean meats, toast, and crackers. Avoid drinking fluids that contain a lot of sugar or caffeine, such as energy drinks, regular sports drinks, and soda. Avoid alcohol. Avoid spicy or fatty foods. General instructions     Take over-the-counter and prescription medicines only as told by your health care provider. Use a cool mist humidifier to add humidity to the air in your home. This can make it easier to breathe. When using a cool mist humidifier, clean it daily. Empty the water and replace it with clean water. Cover your mouth and nose when you cough or sneeze. Wash your hands with soap and water often and for at least 20 seconds,  especially after you cough or sneeze. If soap and water are not available, use alcohol-based hand sanitizer. Keep all follow-up visits. This is important. How is this prevented?  Get an annual flu shot. This is usually available in late summer, fall, or winter. Ask your health care provider when you should get your flu shot. Avoid contact with people who are sick during cold and flu season. This is generally fall and winter. Contact a health care provider if: You develop new symptoms. You have: Chest pain. Diarrhea. A fever. Your cough gets worse. You produce more mucus. You feel nauseous or you vomit. Get help right away if you: Develop shortness of breath or have difficulty breathing. Have skin or nails that turn a bluish color. Have severe pain or stiffness in your neck. Develop a sudden headache or sudden pain in your face or ear. Cannot eat or drink without vomiting. These  symptoms may represent a serious problem that is an emergency. Do not wait to see if the symptoms will go away. Get medical help right away. Call your local emergency services (911 in the [place] Do not drive yourself to the hospital. Summary Influenza, also called "the flu," is a viral infection that primarily affects your respiratory tract. Symptoms of the flu usually begin suddenly and last 4-14 days. Getting an annual flu shot is the best way to prevent getting the flu. Stay home from work or school as told by your health care provider. Unless you are visiting your health care provider, avoid leaving home until your fever has been gone for 24 hours without taking medicine. Keep all follow-up visits. This is important. This information is not intended to replace advice given to you by your health care provider. Make sure you discuss any questions you have with your health care provider. Document Revised: 11/27/2019 Document Reviewed: 11/27/2019 Elsevier Patient Education  [place].    If you have been instructed to have an in-person evaluation today at a local Urgent Care facility, please use the link below. It will take you to a list of all of our available [facility], including address, phone number and hours of operation. Please do not delay care.  [facility]  If you or a family member do not have a primary care provider, use the link below to schedule a visit and establish care. When you choose a [facility] primary care physician or advanced practice provider, you gain a long-term partner in health. Find a Primary Care Provider  Learn more about [facility]'s in-office and virtual care options: [facility]

## 2021-11-29 NOTE — Progress Notes (Signed)
Virtual Visit Consent   Tamara Odom, you are scheduled for a virtual visit with a [facility] provider today. Just as with appointments in the office, your consent must be obtained to participate. Your consent will be active for this visit and any virtual visit you may have with one of our providers in the next 365 days. If you have a MyChart account, a copy of this consent can be sent to you electronically.  As this is a virtual visit, video technology does not allow for your provider to perform a traditional examination. This may limit your provider's ability to fully assess your condition. If your provider identifies any concerns that need to be evaluated in person or the need to arrange testing (such as labs, EKG, etc.), we will make arrangements to do so. Although advances in technology are sophisticated, we cannot ensure that it will always work on either your end or our end. If the connection with a video visit is poor, the visit may have to be switched to a telephone visit. With either a video or telephone visit, we are not always able to ensure that we have a secure connection.  By engaging in this virtual visit, you consent to the provision of healthcare and authorize for your insurance to be billed (if applicable) for the services provided during this visit. Depending on your insurance coverage, you may receive a charge related to this service.  I need to obtain your verbal consent now. Are you willing to proceed with your visit today? Tamara Odom has provided verbal consent on 11/29/2021 for a virtual visit (video or telephone). Tamara Loveless, PA-C  Date: 11/29/2021 10:41 AM  Virtual Visit via Video Note   I, Tamara Odom, connected with  Tamara Odom  ([id number], 1945-10-28) on 11/29/21 at 10:30 AM EDT by a video-enabled telemedicine application and verified that I am speaking with the correct person using two identifiers.  Location: Patient: Virtual Visit Location  Patient: Home Provider: Virtual Visit Location Provider: Home Office   I discussed the limitations of evaluation and management by telemedicine and the availability of in person appointments. The patient expressed understanding and agreed to proceed.    Interactive audio and video communications were attempted, although failed due to patient's inability to connect to video. Continued visit with audio only interaction with patient agreement.   History of Present Illness: Tamara Odom is a 76 y.o. who identifies as a female who was assigned female at birth, and is being seen today for possible influenza.  HPI: Influenza This is a new problem. The current episode started yesterday (grandkids tested positive for flu last week; now she has symptoms that started yesterday, 11/28/21). The problem occurs constantly. The problem has been gradually worsening. Associated symptoms include congestion, coughing (dry), a fever (100.4), headaches, myalgias and a sore throat (a little bit). Pertinent negatives include no chills, diaphoresis, nausea, vertigo, vomiting or weakness. Associated symptoms comments: Rhinorrhea. Nothing aggravates the symptoms. She has tried acetaminophen, NSAIDs, drinking and rest (theraflu) for the symptoms. The treatment provided no relief.      Problems:  Patient Active Problem List   Diagnosis Date Noted   Right knee injury 04/03/2012   Left knee injury 04/03/2012   Elbow pain 04/03/2012    Allergies:  Allergies  Allergen Reactions   Morphine And Related Other (See Comments)    Feel Crazy   Medications:  Current Outpatient Medications:    acetaminophen (TYLENOL) 500 MG tablet, Take  250 mg by mouth daily as needed for mild pain or moderate pain., Disp: , Rfl:    Ascorbic Acid (VITAMIN C PO), Take 1 tablet by mouth daily., Disp: , Rfl:    benzonatate (TESSALON) 100 MG capsule, Take 1 capsule (100 mg total) by mouth 3 (three) times daily as needed., Disp: 30 capsule,  Rfl: 0   chlorpheniramine-HYDROcodone (TUSSIONEX PENNKINETIC ER) 10-8 MG/5ML SUER, Take 5 mLs by mouth every 12 (twelve) hours as needed for cough., Disp: 140 mL, Rfl: 0   Cholecalciferol (VITAMIN D-3) 125 MCG (5000 UT) TABS, Take 5,000 mg by mouth daily., Disp: , Rfl:    Cyanocobalamin (VITAMIN B-12 PO), Take 1 tablet by mouth daily., Disp: , Rfl:    cyclobenzaprine (FLEXERIL) 10 MG tablet, Take 1 tablet (10 mg total) by mouth 2 (two) times daily as needed for muscle spasms. (Patient not taking: Reported on 04/26/2019), Disp: 20 tablet, Rfl: 0   Diclofenac Sodium CR 100 MG 24 hr tablet, Take 1 tablet (100 mg total) by mouth daily. (Patient not taking: Reported on 04/26/2019), Disp: 10 tablet, Rfl: 0   lisinopril-hydrochlorothiazide (ZESTORETIC) 20-12.5 MG tablet, Take 1 tablet by mouth daily., Disp: , Rfl:    methocarbamol (ROBAXIN) 500 MG tablet, Take 1 tablet (500 mg total) by mouth 2 (two) times daily. (Patient not taking: Reported on 04/26/2019), Disp: 20 tablet, Rfl: 0   oseltamivir (TAMIFLU) 75 MG capsule, Take 1 capsule (75 mg total) by mouth 2 (two) times daily., Disp: 10 capsule, Rfl: 0   potassium chloride SA (KLOR-CON) 20 MEQ tablet, Take 1 tablet (20 mEq total) by mouth 2 (two) times daily., Disp: 20 tablet, Rfl: 0   predniSONE (STERAPRED UNI-PAK 21 TAB) 10 MG (21) TBPK tablet, Take 40 mg by mouth daily., Disp: , Rfl:   Observations/Objective: Patient is well-developed, well-nourished in no acute distress.  Resting comfortably at home.  Head is normocephalic, atraumatic.  No labored breathing.  Speech is clear and coherent with logical content.  Patient is alert and oriented at baseline.    Assessment and Plan: 1. Influenza - oseltamivir (TAMIFLU) 75 MG capsule; Take 1 capsule (75 mg total) by mouth 2 (two) times daily.  Dispense: 10 capsule; Refill: 0 - benzonatate (TESSALON) 100 MG capsule; Take 1 capsule (100 mg total) by mouth 3 (three) times daily as needed.  Dispense: 30 capsule;  Refill: 0  - Suspected viral URI with negative covid testing - Possible flu as has had positive flu exposures - Limited testing availability that would delay appropriate treatment waiting on results - Tamiflu prescribed for possible flu - Work note provided - Push fluids - Symptomatic management OTC of choice as needed - Seek in person evaluation if symptoms worsen or fail to improve   Follow Up Instructions: I discussed the assessment and treatment plan with the patient. The patient was provided an opportunity to ask questions and all were answered. The patient agreed with the plan and demonstrated an understanding of the instructions.  A copy of instructions were sent to the patient via MyChart unless otherwise noted below.    The patient was advised to call back or seek an in-person evaluation if the symptoms worsen or if the condition fails to improve as anticipated.  Time:  I spent 15 minutes with the patient via telehealth technology discussing the above problems/concerns.    Tamara Loveless, PA-C

## 2022-01-29 DIAGNOSIS — H04123 Dry eye syndrome of bilateral lacrimal glands: Secondary | ICD-10-CM | POA: Diagnosis not present

## 2022-01-29 DIAGNOSIS — H35363 Drusen (degenerative) of macula, bilateral: Secondary | ICD-10-CM | POA: Diagnosis not present

## 2022-01-29 DIAGNOSIS — H353131 Nonexudative age-related macular degeneration, bilateral, early dry stage: Secondary | ICD-10-CM | POA: Diagnosis not present

## 2022-01-29 DIAGNOSIS — H35372 Puckering of macula, left eye: Secondary | ICD-10-CM | POA: Diagnosis not present

## 2022-03-08 DIAGNOSIS — I700 Atherosclerosis of aorta: Secondary | ICD-10-CM | POA: Diagnosis not present

## 2022-03-08 DIAGNOSIS — I10 Essential (primary) hypertension: Secondary | ICD-10-CM | POA: Diagnosis not present

## 2022-03-08 DIAGNOSIS — Z0000 Encounter for general adult medical examination without abnormal findings: Secondary | ICD-10-CM | POA: Diagnosis not present

## 2022-03-08 DIAGNOSIS — Z79899 Other long term (current) drug therapy: Secondary | ICD-10-CM | POA: Diagnosis not present

## 2022-03-08 DIAGNOSIS — E785 Hyperlipidemia, unspecified: Secondary | ICD-10-CM | POA: Diagnosis not present

## 2022-04-02 ENCOUNTER — Other Ambulatory Visit: Payer: Self-pay | Admitting: Family Medicine

## 2022-04-02 DIAGNOSIS — Z1231 Encounter for screening mammogram for malignant neoplasm of breast: Secondary | ICD-10-CM

## 2022-05-29 ENCOUNTER — Ambulatory Visit: Payer: Medicare HMO

## 2022-07-26 ENCOUNTER — Ambulatory Visit
Admission: RE | Admit: 2022-07-26 | Discharge: 2022-07-26 | Disposition: A | Discharge status: Home / Self Care | Payer: Medicare HMO | Source: Ambulatory Visit | Attending: Family Medicine | Admitting: Family Medicine

## 2022-07-26 DIAGNOSIS — Z1231 Encounter for screening mammogram for malignant neoplasm of breast: Secondary | ICD-10-CM

## 2022-08-13 LAB — COLOGUARD: COLOGUARD: NEGATIVE

## 2022-08-14 ENCOUNTER — Other Ambulatory Visit (HOSPITAL_BASED_OUTPATIENT_CLINIC_OR_DEPARTMENT_OTHER): Payer: Self-pay

## 2022-08-14 DIAGNOSIS — R0681 Apnea, not elsewhere classified: Secondary | ICD-10-CM

## 2023-01-30 ENCOUNTER — Emergency Department (HOSPITAL_COMMUNITY): Payer: Medicare PPO

## 2023-01-30 ENCOUNTER — Other Ambulatory Visit: Payer: Self-pay

## 2023-01-30 ENCOUNTER — Emergency Department (HOSPITAL_COMMUNITY)
Admission: EM | Admit: 2023-01-30 | Discharge: 2023-01-30 | Disposition: A | Discharge status: Home / Self Care | Payer: Medicare PPO | Attending: Emergency Medicine | Admitting: Emergency Medicine

## 2023-01-30 ENCOUNTER — Encounter (HOSPITAL_COMMUNITY): Payer: Self-pay | Admitting: Emergency Medicine

## 2023-01-30 DIAGNOSIS — I10 Essential (primary) hypertension: Secondary | ICD-10-CM | POA: Insufficient documentation

## 2023-01-30 DIAGNOSIS — R1011 Right upper quadrant pain: Secondary | ICD-10-CM | POA: Diagnosis present

## 2023-01-30 DIAGNOSIS — R1084 Generalized abdominal pain: Secondary | ICD-10-CM

## 2023-01-30 DIAGNOSIS — K8050 Calculus of bile duct without cholangitis or cholecystitis without obstruction: Secondary | ICD-10-CM | POA: Insufficient documentation

## 2023-01-30 LAB — COMPREHENSIVE METABOLIC PANEL
ALT: 20 U/L (ref 0–44)
AST: 19 U/L (ref 15–41)
Albumin: 3.8 g/dL (ref 3.5–5.0)
Alkaline Phosphatase: 70 U/L (ref 38–126)
Anion gap: 10 (ref 5–15)
BUN: 10 mg/dL (ref 8–23)
CO2: 23 mmol/L (ref 22–32)
Calcium: 9.1 mg/dL (ref 8.9–10.3)
Chloride: 105 mmol/L (ref 98–111)
Creatinine, Ser: 0.62 mg/dL (ref 0.44–1.00)
GFR, Estimated: >60 mL/min (ref >60)
Glucose, Bld: 111 mg/dL — ABNORMAL HIGH (ref 70–99)
Potassium: 3.5 mmol/L (ref 3.5–5.1)
Sodium: 138 mmol/L (ref 135–145)
Total Bilirubin: 1.3 mg/dL — ABNORMAL HIGH (ref 0.3–1.2)
Total Protein: 7.1 g/dL (ref 6.5–8.1)

## 2023-01-30 LAB — URINALYSIS, MICROSCOPIC (REFLEX)

## 2023-01-30 LAB — CBC
HCT: 45.3 % (ref 36.0–46.0)
Hemoglobin: 14.9 g/dL (ref 12.0–15.0)
MCH: 31 pg (ref 26.0–34.0)
MCHC: 32.9 g/dL (ref 30.0–36.0)
MCV: 94.4 fL (ref 80.0–100.0)
Platelets: 247 10*3/uL (ref 150–400)
RBC: 4.8 MIL/uL (ref 3.87–5.11)
RDW: 13.2 % (ref 11.5–15.5)
WBC: 10.1 10*3/uL (ref 4.0–10.5)
nRBC: 0 % (ref 0.0–0.2)

## 2023-01-30 LAB — URINALYSIS, ROUTINE W REFLEX MICROSCOPIC
Bilirubin Urine: NEGATIVE
Glucose, UA: NEGATIVE mg/dL
Hgb urine dipstick: NEGATIVE
Ketones, ur: NEGATIVE mg/dL
Nitrite: NEGATIVE
Protein, ur: NEGATIVE mg/dL
Specific Gravity, Urine: 1.025 (ref 1.005–1.030)
pH: 6 (ref 5.0–8.0)

## 2023-01-30 LAB — LIPASE, BLOOD: Lipase: 24 U/L (ref 11–51)

## 2023-01-30 MED ORDER — ALUM & MAG HYDROXIDE-SIMETH 200-200-20 MG/5ML PO SUSP
30.0000 mL | Freq: Once | ORAL | Status: AC
  Administered 2023-01-30: 30 mL via ORAL
  Filled 2023-01-30: qty 30

## 2023-01-30 MED ORDER — MAALOX MAX 400-400-40 MG/5ML PO SUSP: 15.0000 mL | Freq: Four times a day (QID) | ORAL | 0 refills | Status: AC | PRN

## 2023-01-30 MED ORDER — ACETAMINOPHEN 500 MG PO TABS
1000.0000 mg | ORAL_TABLET | Freq: Once | ORAL | Status: AC
  Administered 2023-01-30: 1000 mg via ORAL
  Filled 2023-01-30: qty 2

## 2023-01-30 MED ORDER — FAMOTIDINE 20 MG PO TABS
20.0000 mg | ORAL_TABLET | Freq: Once | ORAL | Status: AC
  Administered 2023-01-30: 20 mg via ORAL
  Filled 2023-01-30: qty 1

## 2023-01-30 MED ORDER — ONDANSETRON HCL 4 MG PO TABS: 4.0000 mg | ORAL_TABLET | Freq: Four times a day (QID) | ORAL | 0 refills | Status: AC

## 2023-01-30 MED ORDER — ONDANSETRON 4 MG PO TBDP
4.0000 mg | ORAL_TABLET | Freq: Once | ORAL | Status: AC
  Administered 2023-01-30: 4 mg via ORAL
  Filled 2023-01-30: qty 1

## 2023-01-30 NOTE — ED Triage Notes (Signed)
Pt reports yesterday having back/cervical/shoulder pain, R upper abdominal pain, HA. LBM Monday. Pt reports she took tylenol this AM which helped. Pt reports pain worsens after eating dinner.

## 2023-01-30 NOTE — ED Provider Notes (Signed)
[place] EMERGENCY DEPARTMENT AT [facility] Provider Note   CSN: [id number] Arrival date & time: 01/30/23  1237     History Chief Complaint  Patient presents with   Abdominal Pain    HPI Tamara Odom is a 77 y.o. female presenting for chief point of right upper quadrant pain.  77 year old female history hypertension and hypercholesterolemia, no history of heart disease.  States that she has had 2 days of right upper quadrant radiating to right shoulder pain. States that the pain gets worse after she eats has been associate with nausea.  Primarily belly less than chest pain.  Denies any shortness of breath ago with her symptoms.  Otherwise ambulatory with poor p.o. tolerance due to the discomfort. No history of surgery other than cesarean section 50 years ago. Denies fevers or chills, shortness of breath, chest pain, emesis.   Patient's recorded medical, surgical, social, medication list and allergies were reviewed in the Snapshot window as part of the initial history.   Review of Systems   Review of Systems  Constitutional:  Negative for chills and fever.  HENT:  Negative for ear pain and sore throat.   Eyes:  Negative for pain and visual disturbance.  Respiratory:  Negative for cough and shortness of breath.   Cardiovascular:  Negative for chest pain and palpitations.  Gastrointestinal:  Positive for abdominal pain and nausea. Negative for vomiting.  Genitourinary:  Negative for dysuria and hematuria.  Musculoskeletal:  Negative for arthralgias and back pain.  Skin:  Negative for color change and rash.  Neurological:  Negative for seizures and syncope.  All other systems reviewed and are negative.   Physical Exam Updated Vital Signs BP (!) 122/55   Pulse 85   Temp 98.5 F (36.9 C)   Resp 18   Ht 5' (1.524 m)   Wt 77.1 kg   SpO2 94%   BMI 33.20 kg/m  Physical Exam Vitals and nursing note reviewed.  Constitutional:      General: She is not in acute  distress.    Appearance: She is well-developed.  HENT:     Head: Normocephalic and atraumatic.  Eyes:     Conjunctiva/sclera: Conjunctivae normal.  Cardiovascular:     Rate and Rhythm: Normal rate and regular rhythm.     Heart sounds: No murmur heard. Pulmonary:     Effort: Pulmonary effort is normal. No respiratory distress.     Breath sounds: Normal breath sounds.  Abdominal:     General: There is no distension.     Palpations: Abdomen is soft.     Tenderness: There is abdominal tenderness in the right upper quadrant, epigastric area and periumbilical area. There is no right CVA tenderness, left CVA tenderness or guarding. Positive signs include Murphy's sign. Negative signs include McBurney's sign.  Musculoskeletal:        General: No swelling or tenderness. Normal range of motion.     Cervical back: Neck supple.  Skin:    General: Skin is warm and dry.  Neurological:     General: No focal deficit present.     Mental Status: She is alert and oriented to person, place, and time. Mental status is at baseline.     Cranial Nerves: No cranial nerve deficit.      ED Course/ Medical Decision Making/ A&P    Procedures Procedures   Medications Ordered in ED Medications  alum & mag hydroxide-simeth (MAALOX/MYLANTA) 200-200-20 MG/5ML suspension 30 mL (30 mLs Oral Given 01/30/23  1806)  famotidine (PEPCID) tablet 20 mg (20 mg Oral Given 01/30/23 1806)  acetaminophen (TYLENOL) tablet 1,000 mg (1,000 mg Oral Given 01/30/23 1806)  ondansetron (ZOFRAN-ODT) disintegrating tablet 4 mg (4 mg Oral Given 01/30/23 1806)   Medical Decision Making:   Tamara Odom is a 77 y.o. female who presented to the ED today with abdominal pain, detailed above.    Additional history discussed with patient's family/caregivers.  Patient placed on continuous vitals and telemetry monitoring while in ED which was reviewed periodically.  Complete initial physical exam performed, notably the patient  was  exquisitely tender in the right upper quadrant.     Reviewed and confirmed nursing documentation for past medical history, family history, social history.    Initial Assessment:   With the patient's presentation of abdominal pain, most likely diagnosis is biliary etiology versus peptic ulcer disease. Other diagnoses were considered including (but not limited to) gastroenteritis, colitis, small bowel obstruction, appendicitis, cholecystitis, pancreatitis, nephrolithiasis, UTI, pyleonephritis,. These are considered less likely due to history of present illness and physical exam findings.   This is most consistent with an acute life/limb threatening illness complicated by underlying chronic conditions.   Initial Plan:  CBC/CMP to evaluate for underlying infectious/metabolic etiology for patient's abdominal pain  Lipase to evaluate for pancreatitis  EKG to evaluate for cardiac source of pain  Will start evaluation with right upper quadrant ultrasound.  If this is nondiagnostic for acute pathology will proceed to CT abdomen pelvis for further exclusion of the above pathologies.  If she requires cross-sectional imaging, will likely also include CT angiography of the chest to rule out pulmonary embolism given her tachycardia on arrival though I think this is more from poor p.o. intake. Urinalysis and repeat physical assessment to evaluate for UTI/Pyelonpehritis  Empiric management of symptoms with escalating pain control and antiemetics as needed.   Initial Study Results:   Laboratory  All laboratory results reviewed without evidence of clinically relevant pathology.    EKG EKG was reviewed independently. Rate, rhythm, axis, intervals all examined and without medically relevant abnormality. ST segments without concerns for elevations.    Radiology All images reviewed independently. Agree with radiology report at this time.   US Abdomen Limited RUQ (LIVER/GB)  Result Date: 01/30/2023 CLINICAL  DATA:  [id number] Abdominal pain [id number] EXAM: ULTRASOUND ABDOMEN LIMITED RIGHT UPPER QUADRANT COMPARISON:  07/27/2003. FINDINGS: Gallbladder: Shadowing gallstone.  No wall thickening or pericholecystic fluid. Common bile duct: Diameter: 0.3 cm. Liver: Liver is hyperechoic consistent with fatty infiltration. No focal hepatic lesions identified. No intrahepatic ductal dilatation. Hepatopetal portal venous flow. IMPRESSION: Hepatic fatty infiltration. Cholelithiasis. Electronically Signed   By: Layla Maw M.D.   On: 01/30/2023 21:17    Final Reassessment and Plan:   Patient's history present on his physical exam findings are not consistent with any acute pathology. She does appear to have cholelithiasis but is not symptomatic on my reassessment. Will refer to general surgery in the outpatient setting for ongoing care management patient feels comfortable outpatient care given resolution of syndrome.  Supportive care sent to her pharmacy for ongoing care and management outpatient setting. Given resolution of syndrome, pulmonary embolism/abdomen/pelvis more sinister pathology seems less likely.  Patient will p.o. challenge prior to discharge. Disposition:  I have considered need for hospitalization, however, considering all of the above, I believe this patient is stable for discharge at this time.  Patient/family educated about specific return precautions for given chief complaint and symptoms.  Patient/family educated about follow-up  with PCP.     Patient/family expressed understanding of return precautions and need for follow-up. Patient spoken to regarding all imaging and laboratory results and appropriate follow up for these results. All education provided in verbal form with additional information in written form. Time was allowed for answering of patient questions. Patient discharged.    Emergency Department Medication Summary:   Medications  alum & mag hydroxide-simeth (MAALOX/MYLANTA) 200-200-20  MG/5ML suspension 30 mL (30 mLs Oral Given 01/30/23 1806)  famotidine (PEPCID) tablet 20 mg (20 mg Oral Given 01/30/23 1806)  acetaminophen (TYLENOL) tablet 1,000 mg (1,000 mg Oral Given 01/30/23 1806)  ondansetron (ZOFRAN-ODT) disintegrating tablet 4 mg (4 mg Oral Given 01/30/23 1806)             Clinical Impression:  1. Generalized abdominal pain   2. Biliary colic      Discharge   Final Clinical Impression(s) / ED Diagnoses Final diagnoses:  Generalized abdominal pain  Biliary colic    Rx / DC Orders ED Discharge Orders          Ordered    alum & mag hydroxide-simeth (MAALOX MAX) 400-400-40 MG/5ML suspension  Every 6 hours PRN        01/30/23 2131    ondansetron (ZOFRAN) 4 MG tablet  Every 6 hours        01/30/23 2131              Glyn Ade, MD 01/30/23 2132

## 2023-02-07 ENCOUNTER — Encounter: Payer: Self-pay | Admitting: Family Medicine

## 2023-02-27 ENCOUNTER — Other Ambulatory Visit: Payer: Self-pay | Admitting: Family Medicine

## 2023-02-27 ENCOUNTER — Ambulatory Visit: Payer: Self-pay | Admitting: Surgery

## 2023-02-27 DIAGNOSIS — R1084 Generalized abdominal pain: Secondary | ICD-10-CM

## 2023-02-27 NOTE — H&P (Signed)
History of Present Illness: Tamara Odom is a 77 y.o. female who is seen today as an office consultation for evaluation of New Consultation (Biliary colic)   She has been having frequent pain in the lower abdomen for a while now, and says this now occurs daily.  She also reports that her stools are pencil thin. She has not had any blood in her stools. About a month ago she had an episode of severe pain that radiated to the RUQ. She was seen in the ED on 10/9 when the pain persisted. Tbili was very mildly elevated at 1.3 but labs were otherwise unremarkable, including remaining LFTs and lipase. A RUQ US showed cholelithiasis without signs of acute cholecystitis. Her pain resolved and she was discharged home. She said she continues to have lower abdominal pain daily, but has not had further RUQ pain.    She also brought up today that she has a lump on her back. It has been there for years but seems to have enlarged, and bothers her a lot because it is underneath her bra line.   She not take any blood thinners and does not have any heart disease.  Her only prior abdominal surgeries are 2 C-sections.     Review of Systems: A complete review of systems was obtained from the patient.  I have reviewed this information and discussed as appropriate with the patient.  See HPI as well for other ROS.     Medical History: Past Medical History Past Medical History: Diagnosis Date  Anemia    Anxiety    Arthritis    Hypertension    Sleep apnea         Problem List There is no problem list on file for this patient.     Past Surgical History Past Surgical History: Procedure Laterality Date  CESAREAN SECTION       1976 and 1991      Allergies Allergies Allergen Reactions  Morphine Hallucination     "Make me crazy"      Medications Ordered Prior to Encounter Current Outpatient Medications on File Prior to Visit Medication Sig Dispense Refill  lisinopriL-hydroCHLOROthiazide (ZESTORETIC)  20-12.5 mg tablet Take 1 tablet by mouth once daily        No current facility-administered medications on file prior to visit.      Family History Family History Problem Relation Age of Onset  Skin cancer Father    High blood pressure (Hypertension) Father    Hyperlipidemia (Elevated cholesterol) Father    Diabetes Sister    High blood pressure (Hypertension) Sister    Hyperlipidemia (Elevated cholesterol) Brother    High blood pressure (Hypertension) Brother        Tobacco Use History Social History    Tobacco Use Smoking Status Never Smokeless Tobacco Never      Social History Social History    Socioeconomic History  Marital status: Married Tobacco Use  Smoking status: Never  Smokeless tobacco: Never Vaping Use  Vaping status: Never Used Substance and Sexual Activity  Alcohol use: Not Currently  Drug use: Never      Objective:   Vitals:   02/27/23 0840 BP: (!) 148/88 Pulse: 101 Temp: 36.8 C (98.2 F) SpO2: 96% Weight: 78.4 kg (172 lb 12.8 oz) Height: 154.9 cm (5\' 1" ) PainSc: 0-No pain   Body mass index is 32.65 kg/m.   Physical Exam Vitals reviewed.  Constitutional:      General: She is not in acute distress.  Appearance: Normal appearance.  Eyes:     General: No scleral icterus.    Conjunctiva/sclera: Conjunctivae normal.  Cardiovascular:     Rate and Rhythm: Normal rate and regular rhythm.     Heart sounds: No murmur heard. Pulmonary:     Effort: Pulmonary effort is normal. No respiratory distress.     Breath sounds: Normal breath sounds. No wheezing.  Abdominal:     General: There is no distension.     Palpations: Abdomen is soft.     Tenderness: There is no abdominal tenderness.  Musculoskeletal:        General: Normal range of motion.  Skin:    General: Skin is warm and dry.     Coloration: Skin is not jaundiced.     Comments: Subcutaneous mass on the right mid back, well-circumscribed and mobile, approximately 6 cm in  diameter, consistent with a lipoma.  Neurological:     General: No focal deficit present.     Mental Status: She is alert and oriented to person, place, and time.  Psychiatric:        Mood and Affect: Mood normal.        Behavior: Behavior normal.            Labs, Imaging and Diagnostic Testing: US abdomen 01/30/23: IMPRESSION: Hepatic fatty infiltration. Cholelithiasis.     Assessment and Plan:   Assessment Diagnoses and all orders for this visit:   Lipoma of back   Calculus of gallbladder without cholecystitis without obstruction   Lower abdominal pain -     CT abdomen pelvis with contrast   Left lower quadrant pain -     CT abdomen pelvis with contrast     This is a 77 year old female referred with abdominal pain and cholelithiasis.  After carefully reviewing her symptoms, she has only had 1 episode of right upper quadrant pain which is what prompted her to go to the ED.  Her primary GI symptom is lower abdominal pain, which I feel is separate from her biliary colic.  She has also had a recent change in stool caliber.  Reports her last colonoscopy was 5 years ago and she was told she needed no further screening colonoscopy.  We will obtain a CT abdomen pelvis to further evaluate this lower abdominal pain.  Will contact her with the results of her scan.   Regarding her right upper quadrant pain, this was likely related to her gallstones, however she has only had one episode that seems consistent with biliary colic.  I discussed proceeding with laparoscopic cholecystectomy and reviewed the details of this operation, versus continued up observation since she has only had one episode.  She would prefer to defer a cystectomy for now.  I counseled her to monitor her symptoms closely and to avoid excessively fatty and greasy foods.  If she develops further symptoms of biliary colic, we can proceed with cholecystectomy in the future.   The mass on her back is consistent with a benign  lipoma on exam.  I discussed that this is not a malignant lesion and can safely be observed, however if symptomatic removal can be performed.  It bothers her frequently and she would like to have it removed.  I reviewed the details of surgical excision.  She expressed understanding and agrees to proceed.  She will be contacted to schedule an elective surgery date.   Sophronia Simas, MD [facility] Surgery General, Hepatobiliary and Pancreatic Surgery 02/27/23 10:03 AM

## 2023-03-12 ENCOUNTER — Other Ambulatory Visit: Payer: Self-pay | Admitting: Surgery

## 2023-03-12 DIAGNOSIS — R1030 Lower abdominal pain, unspecified: Secondary | ICD-10-CM

## 2023-03-12 DIAGNOSIS — R1032 Left lower quadrant pain: Secondary | ICD-10-CM

## 2023-03-27 NOTE — Progress Notes (Signed)
Anesthesia Review:  PCP: Cardiologist : Chest x-ray : EKG : 01/31/23  Echo : Stress test: Cardiac Cath :  Activity level:  Sleep Study/ CPAP : Fasting Blood Sugar :      / Checks Blood Sugar -- times a day:   Blood Thinner/ Instructions /Last Dose: ASA / Instructions/ Last Dose :

## 2023-03-27 NOTE — Patient Instructions (Addendum)
SURGICAL WAITING ROOM VISITATION  Patients having surgery or a procedure may have no more than 2 support people in the waiting area - these visitors may rotate.    Children under the age of 50 must have an adult with them who is not the patient.  Due to an increase in RSV and influenza rates and associated hospitalizations, children ages 49 and under may not visit patients in [facility] hospitals.  If the patient needs to stay at the hospital during part of their recovery, the visitor guidelines for inpatient rooms apply. Pre-op nurse will coordinate an appropriate time for 1 support person to accompany patient in pre-op.  This support person may not rotate.    Please refer to the [facility] website for the visitor guidelines for Inpatients (after your surgery is over and you are in a regular room).       Your procedure is scheduled on:  04/11/2023    Report to [facility] Main Entrance    Report to admitting at   [phone]   Call this number if you have problems the morning of surgery [phone]   Do not eat food :After Midnight.   After Midnight you may have the following liquids until _ 0430_____ AM  DAY OF SURGERY  Water Non-Citrus Juices (without pulp, NO RED-Apple, White grape, White cranberry) Black Coffee (NO MILK/CREAM OR CREAMERS, sugar ok)  Clear Tea (NO MILK/CREAM OR CREAMERS, sugar ok) regular and decaf                             Plain Jell-O (NO RED)                                           Fruit ices (not with fruit pulp, NO RED)                                     Popsicles (NO RED)                                                               Sports drinks like Gatorade (NO RED)      The day of surgery:  Drink ONE (1) Pre-Surgery Clear Ensure or G2 at 0430 AM  ( have completed by ) the morning of surgery. Drink in one sitting. Do not sip.  This drink was given to you during your hospital  pre-op appointment visit. Nothing else to drink after  completing the  Pre-Surgery Clear Ensure or G2.          If you have questions, please contact your surgeon's office.      Oral Hygiene is also important to reduce your risk of infection.                                    Remember - BRUSH YOUR TEETH THE MORNING OF SURGERY WITH YOUR REGULAR TOOTHPASTE  DENTURES WILL BE REMOVED PRIOR TO SURGERY PLEASE DO NOT APPLY "  Poly grip" OR ADHESIVES!!!   Do NOT smoke after Midnight   Stop all vitamins and herbal supplements 7 days before surgery.   Take these medicines the morning of surgery with A SIP OF WATER:  none   DO NOT TAKE ANY ORAL DIABETIC MEDICATIONS DAY OF YOUR SURGERY  Bring CPAP mask and tubing day of surgery.                              You may not have any metal on your body including hair pins, jewelry, and body piercing             Do not wear make-up, lotions, powders, perfumes/cologne, or deodorant  Do not wear nail polish including gel and S&S, artificial/acrylic nails, or any other type of covering on natural nails including finger and toenails. If you have artificial nails, gel coating, etc. that needs to be removed by a nail salon please have this removed prior to surgery or surgery may need to be canceled/ delayed if the surgeon/ anesthesia feels like they are unable to be safely monitored.   Do not shave  48 hours prior to surgery.               Men may shave face and neck.   Do not bring valuables to the hospital. [facility] IS NOT             RESPONSIBLE   FOR VALUABLES.   Contacts, glasses, dentures or bridgework may not be worn into surgery.   Bring small overnight bag day of surgery.   DO NOT BRING YOUR HOME MEDICATIONS TO THE HOSPITAL. PHARMACY WILL DISPENSE MEDICATIONS LISTED ON YOUR MEDICATION LIST TO YOU DURING YOUR ADMISSION IN THE HOSPITAL!    Patients discharged on the day of surgery will not be allowed to drive home.  Someone NEEDS to stay with you for the first 24 hours after  anesthesia.   Special Instructions: Bring a copy of your healthcare power of attorney and living will documents the day of surgery if you haven't scanned them before.              Please read over the following fact sheets you were given: IF YOU HAVE QUESTIONS ABOUT YOUR PRE-OP INSTRUCTIONS PLEASE CALL [phone]   If you received a COVID test during your pre-op visit  it is requested that you wear a mask when out in public, stay away from anyone that may not be feeling well and notify your surgeon if you develop symptoms. If you test positive for Covid or have been in contact with anyone that has tested positive in the last 10 days please notify you surgeon.    [place] - Preparing for Surgery Before surgery, you can play an important role.  Because skin is not sterile, your skin needs to be as free of germs as possible.  You can reduce the number of germs on your skin by washing with CHG (chlorahexidine gluconate) soap before surgery.  CHG is an antiseptic cleaner which kills germs and bonds with the skin to continue killing germs even after washing. Please DO NOT use if you have an allergy to CHG or antibacterial soaps.  If your skin becomes reddened/irritated stop using the CHG and inform your nurse when you arrive at Short Stay. Do not shave (including legs and underarms) for at least 48 hours prior to the first CHG shower.  You may  shave your face/neck. Please follow these instructions carefully:  1.  Shower with CHG Soap the night before surgery and the  morning of Surgery.  2.  If you choose to wash your hair, wash your hair first as usual with your  normal  shampoo.  3.  After you shampoo, rinse your hair and body thoroughly to remove the  shampoo.                           4.  Use CHG as you would any other liquid soap.  You can apply chg directly  to the skin and wash                       Gently with a scrungie or clean washcloth.  5.  Apply the CHG Soap to your body ONLY FROM THE  NECK DOWN.   Do not use on face/ open                           Wound or open sores. Avoid contact with eyes, ears mouth and genitals (private parts).                       Wash face,  Genitals (private parts) with your normal soap.             6.  Wash thoroughly, paying special attention to the area where your surgery  will be performed.  7.  Thoroughly rinse your body with warm water from the neck down.  8.  DO NOT shower/wash with your normal soap after using and rinsing off  the CHG Soap.                9.  Pat yourself dry with a clean towel.            10.  Wear clean pajamas.            11.  Place clean sheets on your bed the night of your first shower and do not  sleep with pets. Day of Surgery : Do not apply any lotions/deodorants the morning of surgery.  Please wear clean clothes to the hospital/surgery center.  FAILURE TO FOLLOW THESE INSTRUCTIONS MAY RESULT IN THE CANCELLATION OF YOUR SURGERY PATIENT SIGNATURE_________________________________  NURSE SIGNATURE__________________________________  ________________________________________________________________________

## 2023-04-02 ENCOUNTER — Encounter (HOSPITAL_COMMUNITY)
Admission: RE | Admit: 2023-04-02 | Discharge: 2023-04-02 | Disposition: A | Discharge status: Home / Self Care | Payer: Medicare PPO | Source: Ambulatory Visit

## 2023-04-10 NOTE — Progress Notes (Signed)
COVID Vaccine received:  []  No []  Yes Date of any COVID positive Test in last 90 days:  PCP - Jackelyn Poling, DO  at [facility] Cardiologist -   Dennie Maizes FNP at [facility]  Chest x-ray - 04-26-2019  1v  Epic EKG -  01-31-2023  Epic Stress Test -  ECHO -  Cardiac Cath -   PCR screen: []  Ordered & Completed []   No Order but Needs PROFEND     [x]   N/A for this surgery  Surgery Plan:  [x]  Ambulatory   []  Outpatient in bed  []  Admit Anesthesia:    []  General  []  Spinal  [x]   Choice []   MAC  Pacemaker / ICD device [x]  No []  Yes   Spinal Cord Stimulator:[x]  No []  Yes       History of Sleep Apnea? []  No [x]  Yes   CPAP used?- []  No []  Yes    Does the patient monitor blood sugar?   []  N/A   []  No []  Yes  Patient has: []  NO Hx DM   [x]  Pre-DM   []  DM1  []   DM2 Last A1c was:        on      Does patient have a [employer] or [employer]? []  No []  Yes   Fasting Blood Sugar Ranges-  Checks Blood Sugar _____ times a day  Blood Thinner / Instructions:  none Aspirin Instructions:  none  ERAS Protocol Ordered: []  No  [x]  Yes PRE-SURGERY []  ENSURE  []  G2   [x]  No Drink Ordered Patient is to be NPO after: 0430  Dental hx: []  Dentures:  []  N/A      []  Bridge or Partial:                   []  Loose or Damaged teeth:   Comments:   Activity level: Patient is able / unable to climb a flight of stairs without difficulty; []  No CP  []  No SOB, but would have ___   Patient can / can not perform ADLs without assistance.   Anesthesia review: HTN, OSA-CPAP?, hx ? LFTs, anemia, anxiety  Patient denies shortness of breath, fever, cough and chest pain at PAT appointment.  Patient verbalized understanding and agreement to the Pre-Surgical Instructions that were given to them at this PAT appointment. Patient was also educated of the need to review these PAT instructions again prior to her surgery.I reviewed the appropriate phone numbers to call if they have any and questions or concerns.

## 2023-04-10 NOTE — Patient Instructions (Signed)
SURGICAL WAITING ROOM VISITATION Patients having surgery or a procedure may have no more than 2 support people in the waiting area - these visitors may rotate in the visitor waiting room.   Due to an increase in RSV and influenza rates and associated hospitalizations, children ages 25 and under may not visit patients in [facility] hospitals. If the patient needs to stay at the hospital during part of their recovery, the visitor guidelines for inpatient rooms apply.  PRE-OP VISITATION  Pre-op nurse will coordinate an appropriate time for 1 support person to accompany the patient in pre-op.  This support person may not rotate.  This visitor will be contacted when the time is appropriate for the visitor to come back in the pre-op area.  Please refer to the [facility] website for the visitor guidelines for Inpatients (after your surgery is over and you are in a regular room).  You are not required to quarantine at this time prior to your surgery. However, you must do this: Hand Hygiene often Do NOT share personal items Notify your provider if you are in close contact with someone who has COVID or you develop fever 100.4 or greater, new onset of sneezing, cough, sore throat, shortness of breath or body aches.  If you test positive for Covid or have been in contact with anyone that has tested positive in the last 10 days please notify you surgeon.    Your procedure is scheduled on:  Monday  April 28, 2022  Report to [facility] Main Entrance: Leota Jacobsen entrance where the [employer] is available.   Report to admitting at: 05:15    AM  Call this number if you have any questions or problems the morning of surgery [phone]  Do not eat food after Midnight the night prior to your surgery/procedure.  After Midnight you may have the following liquids until   04:30 AM  DAY OF SURGERY  Clear Liquid Diet Water Black Coffee (sugar ok, NO MILK/CREAM OR CREAMERS)  Tea (sugar ok, NO  MILK/CREAM OR CREAMERS) regular and decaf                             Plain Jell-O  with no fruit (NO RED)                                           Fruit ices (not with fruit pulp, NO RED)                                     Popsicles (NO RED)                                                                  Juice: NO CITRUS JUICES: only apple, WHITE grape, WHITE cranberry Sports drinks like Gatorade or Powerade (NO RED)                FOLLOW ANY ADDITIONAL PRE OP INSTRUCTIONS YOU RECEIVED FROM YOUR SURGEON'S OFFICE!!!   Oral Hygiene is also important to reduce  your risk of infection.        Remember - BRUSH YOUR TEETH THE MORNING OF SURGERY WITH YOUR REGULAR TOOTHPASTE  Do NOT smoke after Midnight the night before surgery.  STOP TAKING all Vitamins, Herbs and supplements 1 week before your surgery.   Take ONLY these medicines the morning of surgery with A SIP OF WATER: Tylenol   If You have been diagnosed with Sleep Apnea - Bring CPAP mask and tubing day of surgery. We will provide you with a CPAP machine on the day of your surgery.                   You may not have any metal on your body including hair pins, jewelry, and body piercing  Do not wear make-up, lotions, powders, perfumes or deodorant  Do not wear nail polish including gel and S&S, artificial / acrylic nails, or any other type of covering on natural nails including finger and toenails. If you have artificial nails, gel coating, etc., that needs to be removed by a nail salon, Please have this removed prior to surgery. Not doing so may mean that your surgery could be cancelled or delayed if the Surgeon or anesthesia staff feels like they are unable to monitor you safely.   Do not shave 48 hours prior to surgery to avoid nicks in your skin which may contribute to postoperative infections.    Contacts, Hearing Aids, dentures or bridgework may not be worn into surgery. DENTURES WILL BE REMOVED PRIOR TO SURGERY PLEASE DO NOT  APPLY "Poly grip" OR ADHESIVES!!!  Patients discharged on the day of surgery will not be allowed to drive home.  Someone NEEDS to stay with you for the first 24 hours after anesthesia.  Do not bring your home medications to the hospital. The Pharmacy will dispense medications listed on your medication list to you during your admission in the Hospital.  Special Instructions: Bring a copy of your healthcare power of attorney and living will documents the day of surgery, if you wish to have them scanned into your [facility] Records- EPIC  Please read over the following fact sheets you were given: IF YOU HAVE QUESTIONS ABOUT YOUR PRE-OP INSTRUCTIONS, PLEASE CALL [phone]   [facility] - Preparing for Surgery Before surgery, you can play an important role.  Because skin is not sterile, your skin needs to be as free of germs as possible.  You can reduce the number of germs on your skin by washing with CHG (chlorahexidine gluconate) soap before surgery.  CHG is an antiseptic cleaner which kills germs and bonds with the skin to continue killing germs even after washing. Please DO NOT use if you have an allergy to CHG or antibacterial soaps.  If your skin becomes reddened/irritated stop using the CHG and inform your nurse when you arrive at Short Stay. Do not shave (including legs and underarms) for at least 48 hours prior to the first CHG shower.  You may shave your face/neck.  Please follow these instructions carefully:  1.  Shower with CHG Soap the night before surgery and the  morning of surgery.  2.  If you choose to wash your hair, wash your hair first as usual with your normal  shampoo.  3.  After you shampoo, rinse your hair and body thoroughly to remove the shampoo.  4.  Use CHG as you would any other liquid soap.  You can apply chg directly to the skin and wash.  Gently with a scrungie or clean washcloth.  5.  Apply the CHG Soap to your body ONLY FROM THE  NECK DOWN.   Do not use on face/ open                           Wound or open sores. Avoid contact with eyes, ears mouth and genitals (private parts).                       Wash face,  Genitals (private parts) with your normal soap.             6.  Wash thoroughly, paying special attention to the area where your  surgery  will be performed.  7.  Thoroughly rinse your body with warm water from the neck down.  8.  DO NOT shower/wash with your normal soap after using and rinsing off the CHG Soap.            9.  Pat yourself dry with a clean towel.            10.  Wear clean pajamas.            11.  Place clean sheets on your bed the night of your first shower and do not  sleep with pets.  ON THE DAY OF SURGERY : Do not apply any lotions/deodorants the morning of surgery.  Please wear clean clothes to the hospital/surgery center.     FAILURE TO FOLLOW THESE INSTRUCTIONS MAY RESULT IN THE CANCELLATION OF YOUR SURGERY  PATIENT SIGNATURE_________________________________  NURSE SIGNATURE__________________________________  ________________________________________________________________________

## 2023-04-11 ENCOUNTER — Encounter (HOSPITAL_COMMUNITY)
Admission: RE | Admit: 2023-04-11 | Discharge: 2023-04-11 | Disposition: A | Discharge status: Home / Self Care | Payer: Medicare PPO | Source: Ambulatory Visit | Attending: Anesthesiology | Admitting: Anesthesiology

## 2023-04-11 DIAGNOSIS — Z01818 Encounter for other preprocedural examination: Secondary | ICD-10-CM

## 2023-04-11 DIAGNOSIS — I10 Essential (primary) hypertension: Secondary | ICD-10-CM

## 2023-04-15 ENCOUNTER — Other Ambulatory Visit: Payer: Self-pay

## 2023-04-15 ENCOUNTER — Encounter (HOSPITAL_COMMUNITY): Payer: Self-pay

## 2023-04-15 ENCOUNTER — Encounter (HOSPITAL_COMMUNITY)
Admission: RE | Admit: 2023-04-15 | Discharge: 2023-04-15 | Disposition: A | Discharge status: Home / Self Care | Payer: Medicare PPO | Source: Ambulatory Visit | Attending: Surgery | Admitting: Surgery

## 2023-04-15 DIAGNOSIS — I10 Essential (primary) hypertension: Secondary | ICD-10-CM | POA: Diagnosis not present

## 2023-04-15 DIAGNOSIS — Z01818 Encounter for other preprocedural examination: Secondary | ICD-10-CM

## 2023-04-15 DIAGNOSIS — Z01812 Encounter for preprocedural laboratory examination: Secondary | ICD-10-CM | POA: Diagnosis present

## 2023-04-15 HISTORY — DX: Unspecified osteoarthritis, unspecified site: M19.90

## 2023-04-15 HISTORY — DX: Prediabetes: R73.03

## 2023-04-15 HISTORY — DX: Anemia, unspecified: D64.9

## 2023-04-15 HISTORY — DX: Respiratory tuberculosis unspecified: A15.9

## 2023-04-15 HISTORY — DX: Sleep apnea, unspecified: G47.30

## 2023-04-15 HISTORY — DX: Other specified postprocedural states: Z98.890

## 2023-04-15 HISTORY — DX: Anxiety disorder, unspecified: F41.9

## 2023-04-15 LAB — CBC
HCT: 46.3 % — ABNORMAL HIGH (ref 36.0–46.0)
Hemoglobin: 14.7 g/dL (ref 12.0–15.0)
MCH: 31 pg (ref 26.0–34.0)
MCHC: 31.7 g/dL (ref 30.0–36.0)
MCV: 97.7 fL (ref 80.0–100.0)
Platelets: 244 10*3/uL (ref 150–400)
RBC: 4.74 MIL/uL (ref 3.87–5.11)
RDW: 13.2 % (ref 11.5–15.5)
WBC: 7 10*3/uL (ref 4.0–10.5)
nRBC: 0 % (ref 0.0–0.2)

## 2023-04-15 LAB — COMPREHENSIVE METABOLIC PANEL
ALT: 16 U/L (ref 0–44)
AST: 15 U/L (ref 15–41)
Albumin: 4 g/dL (ref 3.5–5.0)
Alkaline Phosphatase: 80 U/L (ref 38–126)
Anion gap: 7 (ref 5–15)
BUN: 15 mg/dL (ref 8–23)
CO2: 24 mmol/L (ref 22–32)
Calcium: 8.9 mg/dL (ref 8.9–10.3)
Chloride: 103 mmol/L (ref 98–111)
Creatinine, Ser: 0.57 mg/dL (ref 0.44–1.00)
GFR, Estimated: >60 mL/min (ref >60)
Glucose, Bld: 94 mg/dL (ref 70–99)
Potassium: 3.8 mmol/L (ref 3.5–5.1)
Sodium: 134 mmol/L — ABNORMAL LOW (ref 135–145)
Total Bilirubin: 0.7 mg/dL (ref <1.2)
Total Protein: 6.9 g/dL (ref 6.5–8.1)

## 2023-04-15 NOTE — Patient Instructions (Signed)
SURGICAL WAITING ROOM VISITATION Patients having surgery or a procedure may have no more than 2 support people in the waiting area - these visitors may rotate in the visitor waiting room.   Due to an increase in RSV and influenza rates and associated hospitalizations, children ages 63 and under may not visit patients in [facility] hospitals. If the patient needs to stay at the hospital during part of their recovery, the visitor guidelines for inpatient rooms apply.   PRE-OP VISITATION  Pre-op nurse will coordinate an appropriate time for 1 support person to accompany the patient in pre-op.  This support person may not rotate.  This visitor will be contacted when the time is appropriate for the visitor to come back in the pre-op area.   Please refer to the [facility] website for the visitor guidelines for Inpatients (after your surgery is over and you are in a regular room).   You are not required to quarantine at this time prior to your surgery. However, you must do this: Hand Hygiene often Do NOT share personal items Notify your provider if you are in close contact with someone who has COVID or you develop fever 100.4 or greater, new onset of sneezing, cough, sore throat, shortness of breath or body aches.  If you test positive for Covid or have been in contact with anyone that has tested positive in the last 10 days please notify you surgeon.     Your procedure is scheduled on:  Monday  April 28, 2022   Report to [facility] Main Entrance: Leota Jacobsen entrance where the [employer] is available.    Report to admitting at: 05:15    AM   Call this number if you have any questions or problems the morning of surgery [phone]   Do not eat food after Midnight the night prior to your surgery/procedure.   After Midnight you may have the following liquids until   04:30 AM  DAY OF SURGERY   Clear Liquid Diet Water Black Coffee (sugar ok, NO MILK/CREAM OR CREAMERS)  Tea (sugar  ok, NO MILK/CREAM OR CREAMERS) regular and decaf                             Plain Jell-O  with no fruit (NO RED)                                           Fruit ices (not with fruit pulp, NO RED)                                     Popsicles (NO RED)                                                                  Juice: NO CITRUS JUICES: only apple, WHITE grape, WHITE cranberry Sports drinks like Gatorade or Powerade (NO RED)                       FOLLOW ANY ADDITIONAL PRE  OP INSTRUCTIONS YOU RECEIVED FROM YOUR SURGEON'S OFFICE!!!    Oral Hygiene is also important to reduce your risk of infection.        Remember - BRUSH YOUR TEETH THE MORNING OF SURGERY WITH YOUR REGULAR TOOTHPASTE   Do NOT smoke after Midnight the night before surgery.   STOP TAKING all Vitamins, Herbs and supplements 1 week before your surgery.    Take ONLY these medicines the morning of surgery with A SIP OF WATER: Tylenol   You may not have any metal on your body including hair pins, jewelry, and body piercing   Do not wear make-up, lotions, powders, perfumes or deodorant   Do not wear nail polish including gel and S&S, artificial / acrylic nails, or any other type of covering on natural nails including finger and toenails. If you have artificial nails, gel coating, etc., that needs to be removed by a nail salon, Please have this removed prior to surgery. Not doing so may mean that your surgery could be cancelled or delayed if the Surgeon or anesthesia staff feels like they are unable to monitor you safely.    Do not shave 48 hours prior to surgery to avoid nicks in your skin which may contribute to postoperative infections.      Contacts, Hearing Aids, dentures or bridgework may not be worn into surgery. DENTURES WILL BE REMOVED PRIOR TO SURGERY PLEASE DO NOT APPLY "Poly grip" OR ADHESIVES!!!   Patients discharged on the day of surgery will not be allowed to drive home.  Someone NEEDS to stay with you for the  first 24 hours after anesthesia.   Do not bring your home medications to the hospital. The Pharmacy will dispense medications listed on your medication list to you during your admission in the Hospital.    Please read over the following fact sheets you were given: IF YOU HAVE QUESTIONS ABOUT YOUR PRE-OP INSTRUCTIONS, PLEASE CALL [phone]     [facility] - Preparing for Surgery Before surgery, you can play an important role.  Because skin is not sterile, your skin needs to be as free of germs as possible.  You can reduce the number of germs on your skin by washing with CHG (chlorahexidine gluconate) soap before surgery.  CHG is an antiseptic cleaner which kills germs and bonds with the skin to continue killing germs even after washing. Please DO NOT use if you have an allergy to CHG or antibacterial soaps.  If your skin becomes reddened/irritated stop using the CHG and inform your nurse when you arrive at Short Stay. Do not shave (including legs and underarms) for at least 48 hours prior to the first CHG shower.  You may shave your face/neck.   Please follow these instructions carefully:             1.  Shower with CHG Soap the night before surgery and the  morning of surgery.             2.  If you choose to wash your hair, wash your hair first as usual with your normal  shampoo.             3.  After you shampoo, rinse your hair and body thoroughly to remove the shampoo.  4.  Use CHG as you would any other liquid soap.  You can apply chg directly to the skin and wash.  Gently with a scrungie or clean washcloth.             5.  Apply the CHG Soap to your body ONLY FROM THE NECK DOWN.   Do not use on face/ open                           Wound or open sores. Avoid contact with eyes, ears mouth and genitals (private parts).                       Wash face,  Genitals (private parts) with your normal soap.             6.  Wash thoroughly, paying special  attention to the area where your            surgery  will be performed.             7.  Thoroughly rinse your body with warm water from the neck down.             8.  DO NOT shower/wash with your normal soap after using and rinsing off the CHG Soap.            9.  Pat yourself dry with a clean towel.            10.  Wear clean pajamas.            11.  Place clean sheets on your bed the night of your first shower and do not  sleep with pets.   ON THE DAY OF SURGERY : Do not apply any lotions/deodorants the morning of surgery.  Please wear clean clothes to the hospital/surgery center.         FAILURE TO FOLLOW THESE INSTRUCTIONS MAY RESULT IN THE CANCELLATION OF YOUR SURGERY   PATIENT SIGNATURE_________________________________   NURSE SIGNATURE__________________________________   ________________________________________________________________________

## 2023-04-15 NOTE — Progress Notes (Signed)
COVID Vaccine received:  [x]  No []  Yes Date of any COVID positive Test in last 90 days:  none   PCP - [facility] in [place] ? Doctor name Cardiologist - none    Chest x-ray - 04-26-2019  1v  Epic EKG -  01-31-2023  Epic Stress Test -  ECHO -  Cardiac Cath -    PCR screen: []  Ordered & Completed []   No Order but Needs PROFEND     [x]   N/A for this surgery   Surgery Plan:  [x]  Ambulatory   []  Outpatient in bed  []  Admit Anesthesia:    []  General  []  Spinal  [x]   Choice []   MAC   Pacemaker / ICD device [x]  No []  Yes   Spinal Cord Stimulator:[x]  No []  Yes       History of Sleep Apnea? []  No [x]  Yes    mild CPAP used?- [x]  No []  Yes     Does the patient monitor blood sugar?   []  N/A   [x]  No []  Yes  Patient has: []  NO Hx DM   [x]  Pre-DM   []  DM1  []   DM2 No Medication for Pre-DM, diet only  Blood Thinner / Instructions:  none Aspirin Instructions:  none   ERAS Protocol Ordered: []  No  [x]  Yes PRE-SURGERY []  ENSURE  []  G2   [x]  No Drink Ordered Patient is to be NPO after: 0430   Dental hx: [x]  Dentures: Top denture plate []  N/A      []  Bridge or Partial:                   []  Loose or Damaged teeth:    Activity level: Patient is able to climb a flight of stairs without difficulty; [x]  No CP  [x]  No SOB  Patient can perform ADLs without assistance.    Anesthesia review: HTN, OSA-no CPAP,  hx ? LFTs, anemia, anxiety   Patient denies shortness of breath, fever, cough and chest pain at PAT appointment.   Patient verbalized understanding and agreement to the Pre-Surgical Instructions that were given to them at this PAT appointment. Patient was also educated of the need to review these PAT instructions again prior to her surgery.I reviewed the appropriate phone numbers to call if they have any and questions or concerns.

## 2023-04-28 NOTE — Anesthesia Preprocedure Evaluation (Addendum)
 Anesthesia Evaluation  Patient identified by MRN, date of birth, ID band Patient awake    Reviewed: Allergy & Precautions, H&P , NPO status , Patient's Chart, lab work & pertinent test results  History of Anesthesia Complications (+) PONV and history of anesthetic complications  Airway Mallampati: III  TM Distance: >3 FB Neck ROM: Full    Dental no notable dental hx. (+) Poor Dentition, Upper Dentures, Dental Advisory Given   Pulmonary sleep apnea    Pulmonary exam normal breath sounds clear to auscultation       Cardiovascular Exercise Tolerance: Good hypertension, Pt. on medications  Rhythm:Regular Rate:Normal     Neuro/Psych   Anxiety     negative neurological ROS     GI/Hepatic negative GI ROS, Neg liver ROS,,,  Endo/Other  negative endocrine ROS    Renal/GU negative Renal ROS  negative genitourinary   Musculoskeletal  (+) Arthritis , Osteoarthritis,    Abdominal   Peds  Hematology  (+) Blood dyscrasia, anemia   Anesthesia Other Findings   Reproductive/Obstetrics negative OB ROS                             Anesthesia Physical Anesthesia Plan  ASA: 3  Anesthesia Plan: General   Post-op Pain Management: Tylenol  PO (pre-op)*   Induction: Intravenous  PONV Risk Score and Plan: 4 or greater and Ondansetron , Dexamethasone , Propofol  infusion and TIVA  Airway Management Planned: Oral ETT  Additional Equipment:   Intra-op Plan:   Post-operative Plan: Extubation in OR  Informed Consent: I have reviewed the patients History and Physical, chart, labs and discussed the procedure including the risks, benefits and alternatives for the proposed anesthesia with the patient or authorized representative who has indicated his/her understanding and acceptance.     Dental advisory given  Plan Discussed with: CRNA  Anesthesia Plan Comments:        Anesthesia Quick Evaluation

## 2023-04-29 ENCOUNTER — Other Ambulatory Visit: Payer: Self-pay

## 2023-04-29 ENCOUNTER — Encounter (HOSPITAL_COMMUNITY)
Admission: RE | Disposition: A | Discharge status: Home / Self Care | Payer: Self-pay | Source: Ambulatory Visit | Attending: Surgery

## 2023-04-29 ENCOUNTER — Ambulatory Visit (HOSPITAL_COMMUNITY): Payer: Self-pay | Admitting: Anesthesiology

## 2023-04-29 ENCOUNTER — Ambulatory Visit (HOSPITAL_COMMUNITY)
Admission: RE | Admit: 2023-04-29 | Discharge: 2023-04-29 | Disposition: A | Discharge status: Home / Self Care | Payer: Medicare PPO | Source: Ambulatory Visit | Attending: Surgery | Admitting: Surgery

## 2023-04-29 ENCOUNTER — Other Ambulatory Visit (HOSPITAL_COMMUNITY): Payer: Self-pay

## 2023-04-29 ENCOUNTER — Ambulatory Visit (HOSPITAL_BASED_OUTPATIENT_CLINIC_OR_DEPARTMENT_OTHER): Payer: Medicare PPO | Admitting: Anesthesiology

## 2023-04-29 ENCOUNTER — Other Ambulatory Visit (HOSPITAL_BASED_OUTPATIENT_CLINIC_OR_DEPARTMENT_OTHER): Payer: Self-pay

## 2023-04-29 ENCOUNTER — Encounter (HOSPITAL_COMMUNITY): Payer: Self-pay | Admitting: Surgery

## 2023-04-29 DIAGNOSIS — G4730 Sleep apnea, unspecified: Secondary | ICD-10-CM

## 2023-04-29 DIAGNOSIS — I10 Essential (primary) hypertension: Secondary | ICD-10-CM | POA: Diagnosis not present

## 2023-04-29 DIAGNOSIS — D171 Benign lipomatous neoplasm of skin and subcutaneous tissue of trunk: Secondary | ICD-10-CM

## 2023-04-29 DIAGNOSIS — R7303 Prediabetes: Secondary | ICD-10-CM | POA: Diagnosis not present

## 2023-04-29 DIAGNOSIS — F419 Anxiety disorder, unspecified: Secondary | ICD-10-CM

## 2023-04-29 HISTORY — PX: LIPOMA EXCISION: SHX5283

## 2023-04-29 SURGERY — EXCISION LIPOMA
Anesthesia: General

## 2023-04-29 MED ORDER — ACETAMINOPHEN 500 MG PO TABS
1000.0000 mg | ORAL_TABLET | Freq: Once | ORAL | Status: AC
  Administered 2023-04-29: 500 mg via ORAL
  Filled 2023-04-29: qty 2

## 2023-04-29 MED ORDER — FENTANYL CITRATE (PF) 100 MCG/2ML IJ SOLN
INTRAMUSCULAR | Status: AC
  Filled 2023-04-29: qty 2

## 2023-04-29 MED ORDER — TRAMADOL HCL 50 MG PO TABS
50.0000 mg | ORAL_TABLET | Freq: Four times a day (QID) | ORAL | 0 refills | Status: AC | PRN
  Filled 2023-04-29: qty 8, 2d supply, fill #0

## 2023-04-29 MED ORDER — FENTANYL CITRATE PF 50 MCG/ML IJ SOSY: 25.0000 ug | PREFILLED_SYRINGE | INTRAMUSCULAR | Status: DC | PRN

## 2023-04-29 MED ORDER — FENTANYL CITRATE (PF) 100 MCG/2ML IJ SOLN
INTRAMUSCULAR | Status: DC | PRN
  Administered 2023-04-29: 100 ug via INTRAVENOUS

## 2023-04-29 MED ORDER — LIDOCAINE HCL (CARDIAC) PF 100 MG/5ML IV SOSY
PREFILLED_SYRINGE | INTRAVENOUS | Status: DC | PRN
  Administered 2023-04-29: 60 mg via INTRAVENOUS

## 2023-04-29 MED ORDER — DEXAMETHASONE SODIUM PHOSPHATE 4 MG/ML IJ SOLN
INTRAMUSCULAR | Status: DC | PRN
  Administered 2023-04-29: 5 mg via INTRAVENOUS

## 2023-04-29 MED ORDER — 0.9 % SODIUM CHLORIDE (POUR BTL) OPTIME
TOPICAL | Status: DC | PRN
  Administered 2023-04-29: 1000 mL

## 2023-04-29 MED ORDER — PROPOFOL 10 MG/ML IV BOLUS
INTRAVENOUS | Status: AC
  Filled 2023-04-29: qty 20

## 2023-04-29 MED ORDER — BUPIVACAINE HCL 0.25 % IJ SOLN
INTRAMUSCULAR | Status: DC | PRN
  Administered 2023-04-29: 30 mL

## 2023-04-29 MED ORDER — LACTATED RINGERS IV SOLN: INTRAVENOUS | Status: DC

## 2023-04-29 MED ORDER — PROPOFOL 10 MG/ML IV BOLUS
INTRAVENOUS | Status: DC | PRN
  Administered 2023-04-29: 40 mg via INTRAVENOUS
  Administered 2023-04-29: 100 mg via INTRAVENOUS

## 2023-04-29 MED ORDER — ONDANSETRON HCL 4 MG/2ML IJ SOLN
INTRAMUSCULAR | Status: DC | PRN
  Administered 2023-04-29: 4 mg via INTRAVENOUS

## 2023-04-29 MED ORDER — SUGAMMADEX SODIUM 200 MG/2ML IV SOLN
INTRAVENOUS | Status: DC | PRN
  Administered 2023-04-29: 160 mg via INTRAVENOUS

## 2023-04-29 MED ORDER — ORAL CARE MOUTH RINSE: 15.0000 mL | Freq: Once | OROMUCOSAL | Status: AC

## 2023-04-29 MED ORDER — BUPIVACAINE HCL (PF) 0.25 % IJ SOLN
INTRAMUSCULAR | Status: AC
  Filled 2023-04-29: qty 30

## 2023-04-29 MED ORDER — CHLORHEXIDINE GLUCONATE 0.12 % MT SOLN
15.0000 mL | Freq: Once | OROMUCOSAL | Status: AC
  Administered 2023-04-29: 15 mL via OROMUCOSAL

## 2023-04-29 MED ORDER — ROCURONIUM BROMIDE 100 MG/10ML IV SOLN
INTRAVENOUS | Status: DC | PRN
  Administered 2023-04-29: 40 mg via INTRAVENOUS

## 2023-04-29 MED ORDER — LACTATED RINGERS IV SOLN: INTRAVENOUS | Status: DC | PRN

## 2023-04-29 MED ORDER — ACETAMINOPHEN 500 MG PO TABS: 1000.0000 mg | ORAL_TABLET | ORAL | Status: DC

## 2023-04-29 MED ORDER — CEFAZOLIN SODIUM-DEXTROSE 2-4 GM/100ML-% IV SOLN
2.0000 g | INTRAVENOUS | Status: AC
  Administered 2023-04-29: 2 g via INTRAVENOUS
  Filled 2023-04-29: qty 100

## 2023-04-29 SURGICAL SUPPLY — 29 items
BAG COUNTER SPONGE SURGICOUNT (BAG) ×1 IMPLANT
BLADE SURG 15 STRL LF DISP TIS (BLADE) ×1 IMPLANT
BLADE SURG SZ10 CARB STEEL (BLADE) ×1 IMPLANT
CHLORAPREP W/TINT 26 (MISCELLANEOUS) ×1 IMPLANT
CNTNR URN SCR LID CUP LEK RST (MISCELLANEOUS) IMPLANT
COVER SURGICAL LIGHT HANDLE (MISCELLANEOUS) ×1 IMPLANT
DERMABOND ADVANCED .7 DNX12 (GAUZE/BANDAGES/DRESSINGS) IMPLANT
DRAPE LAPAROSCOPIC ABDOMINAL (DRAPES) ×1 IMPLANT
ELECT PENCIL ROCKER SW 15FT (MISCELLANEOUS) ×1 IMPLANT
ELECT REM PT RETURN 15FT ADLT (MISCELLANEOUS) ×1 IMPLANT
GAUZE SPONGE 4X4 12PLY STRL (GAUZE/BANDAGES/DRESSINGS) IMPLANT
GLOVE BIOGEL PI IND STRL 6 (GLOVE) ×1 IMPLANT
GLOVE BIOGEL PI MICRO STRL 5.5 (GLOVE) ×1 IMPLANT
GOWN STRL REUS W/ TWL LRG LVL3 (GOWN DISPOSABLE) ×1 IMPLANT
KIT BASIN OR (CUSTOM PROCEDURE TRAY) ×1 IMPLANT
KIT TURNOVER KIT A (KITS) ×1 IMPLANT
NDL HYPO 25X1 1.5 SAFETY (NEEDLE) IMPLANT
NEEDLE HYPO 25X1 1.5 SAFETY (NEEDLE)
NS IRRIG 1000ML POUR BTL (IV SOLUTION) ×1 IMPLANT
PACK BASIC VI WITH GOWN DISP (CUSTOM PROCEDURE TRAY) ×1 IMPLANT
SPIKE FLUID TRANSFER (MISCELLANEOUS) ×1 IMPLANT
SPONGE T-LAP 18X18 ~~LOC~~+RFID (SPONGE) IMPLANT
SPONGE T-LAP 4X18 ~~LOC~~+RFID (SPONGE) IMPLANT
SUT MNCRL AB 4-0 PS2 18 (SUTURE) ×1 IMPLANT
SUT VIC AB 3-0 SH 18 (SUTURE) ×1 IMPLANT
SYR BULB IRRIG 60ML STRL (SYRINGE) IMPLANT
SYR CONTROL 10ML LL (SYRINGE) IMPLANT
TOWEL GREEN STERILE FF (TOWEL DISPOSABLE) ×2 IMPLANT
YANKAUER SUCT BULB TIP NO VENT (SUCTIONS) ×1 IMPLANT

## 2023-04-29 NOTE — Anesthesia Postprocedure Evaluation (Signed)
 Anesthesia Post Note  Patient: Tamara Odom  Procedure(s) Performed: BACK LIPOMA EXCISION     Patient location during evaluation: PACU Anesthesia Type: General Level of consciousness: awake and alert Pain management: pain level controlled Vital Signs Assessment: post-procedure vital signs reviewed and stable Respiratory status: spontaneous breathing, nonlabored ventilation and respiratory function stable Cardiovascular status: blood pressure returned to baseline and stable Postop Assessment: no apparent nausea or vomiting Anesthetic complications: no  No notable events documented.  Last Vitals:  Vitals:   04/29/23 0900 04/29/23 0915  BP: 139/76 (!) 142/70  Pulse: 83 79  Resp: (!) 21 17  Temp:    SpO2: 94% 95%    Last Pain:  Vitals:   04/29/23 0900  TempSrc:   PainSc: 0-No pain                 Breindel Collier,W. EDMOND

## 2023-04-29 NOTE — Anesthesia Procedure Notes (Signed)
 Procedure Name: Intubation Date/Time: 04/29/2023 8:06 AM  Performed by: Dartha Meckel, CRNAPre-anesthesia Checklist: Patient identified, Emergency Drugs available, Suction available and Patient being monitored Patient Re-evaluated:Patient Re-evaluated prior to induction Oxygen Delivery Method: Circle system utilized Preoxygenation: Pre-oxygenation with 100% oxygen Induction Type: IV induction Ventilation: Mask ventilation without difficulty Laryngoscope Size: Mac and 3 Grade View: Grade I Tube type: Oral Tube size: 7.0 mm Number of attempts: 1 Airway Equipment and Method: Stylet and Oral airway Placement Confirmation: ETT inserted through vocal cords under direct vision, positive ETCO2 and breath sounds checked- equal and bilateral Secured at: 21 cm Tube secured with: Tape Dental Injury: Teeth and Oropharynx as per pre-operative assessment

## 2023-04-29 NOTE — Transfer of Care (Signed)
 Immediate Anesthesia Transfer of Care Note  Patient: Tamara Odom  Procedure(s) Performed: BACK LIPOMA EXCISION  Patient Location: PACU  Anesthesia Type:General  Level of Consciousness: awake and alert   Airway & Oxygen Therapy: Patient Spontanous Breathing and Patient connected to face mask oxygen  Post-op Assessment: Report given to RN and Post -op Vital signs reviewed and stable  Post vital signs: Reviewed and stable  Last Vitals:  Vitals Value Taken Time  BP 158/89 04/29/23 0840  Temp    Pulse 87 04/29/23 0842  Resp 25 04/29/23 0842  SpO2 99 % 04/29/23 0842  Vitals shown include unfiled device data.  Last Pain:  Vitals:   04/29/23 0642  TempSrc:   PainSc: 0-No pain         Complications: No notable events documented.

## 2023-04-29 NOTE — H&P (Signed)
 Tamara Odom is an 78 y.o. female.   Chief Complaint: lipoma HPI: Tamara Odom is a 78 yo female with a mass on the back consistent with a lipoma. She would like to have it removed and presents today for surgery.  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 She has never used smokeless tobacco. She reports that she does not drink alcohol and does not use drugs.  Allergies:  Allergies  Allergen Reactions   Morphine And Codeine Other (See Comments)    Feel Crazy    Medications Prior to Admission  Medication Sig Dispense Refill   acetaminophen  (TYLENOL ) 650 MG CR tablet Take 325 mg by mouth daily.     lisinopril-hydrochlorothiazide (ZESTORETIC) 20-12.5 MG tablet Take 1 tablet by mouth daily.     alum & mag hydroxide-simeth (MAALOX MAX) 400-400-40 MG/5ML suspension Take 15 mLs by mouth every 6 (six) hours as needed for indigestion. (Patient not taking: Reported on 03/28/2023) 355 mL 0   Multiple Vitamins-Minerals (HAIR SKIN & NAILS PO) Take 1 tablet by mouth daily.     ondansetron  (ZOFRAN ) 4 MG tablet Take 1 tablet (4 mg total) by mouth every 6 (six) hours. (Patient not taking: Reported on 03/28/2023) 12 tablet 0    No results found for this or any previous visit (from the past 48 hours). No results found.  Review of Systems  Blood pressure (!) 146/95, pulse 96,  temperature 98.6 F (37 C), temperature source Oral, resp. rate 18, height 5' (1.524 m), weight 78 kg, SpO2 94%. Physical Exam Constitutional:      General: She is not in acute distress.    Appearance: Normal appearance.  HENT:     Head: Normocephalic and atraumatic.  Pulmonary:     Effort: Pulmonary effort is normal. No respiratory distress.  Skin:    Comments: Mass on left mid-back, well-circumscribed and mobile, about 6cm in diameter.  Neurological:     General: No focal deficit present.     Mental Status: She is alert and oriented to person, place, and time.      Assessment/Plan 78 yo female with benign lipoma of back. Proceed to OR for excision. Plan for discharge home postoperatively. Surgical details were reviewed and informed consent obtained, all questions answered.  Leonor LITTIE Dawn, MD 04/29/2023, 7:29 AM

## 2023-04-29 NOTE — Op Note (Signed)
 Date: 04/29/23  Patient: Tamara Odom MRN: [id number]  Preoperative Diagnosis: Lipoma of back Postoperative Diagnosis: Same  Procedure: Lipoma excision (10cm x 8cm x 2cm)  Surgeon: Leonor Dawn, MD  EBL: Minimal  Anesthesia: General  Specimens: Back lipoma  Indications: Ms. Jans is a 78 yo female who presented with a mass on the mid-back. Physical exam was consistent with a lipoma, and the patient had symptoms. After a discussion of the risks and benefits of surgery, she elected to proceed with excision.   Findings: Well-circumscribed fatty mass measuring 10x8x2cm, consistent with a benign lipoma.  Procedure details: Informed consent was obtained in the preoperative area prior to the procedure. The patient was brought to the operating room, general anesthesia was induced and appropriate lines and drains were placed for intraoperative monitoring. Perioperative antibiotics were administered per SCIP guidelines. The patient was placed in the prone position, and the back was prepped and draped in the usual sterile fashion. A pre-procedure timeout was taken verifying patient identity, surgical site and procedure to be performed.  A transverse skin incision along the natural tension lines of the skin was made overlying the palpable mass. The subcutaneous tissue was divided with cautery. A well-circumscribed fatty mass was encountered. This was dissected out of the subcutaneous tissue using blunt dissection and cautery. The mass was adjacent to the underlying muscle fascia, but there was no invasion into muscle. The filmy attachments to the fascia were divided with cautery and the mass was completely excised. It was measured and sent for routine pathology. The wound was irrigated and hemostasis was achieved with cautery. Scarpa's layer was closed with interrupted 3-0 Vicryl sutures, and the deep dermis was closed with interrupted 3-0 Vicryls. The subcuticular layer was closed with a running 4-0  monocryl suture. Dermabond was applied.  The patient tolerated the procedure well with no apparent complications. All counts were correct x2 at the end of the procedure. The patient was extubated and taken to PACU in stable condition.  Leonor Dawn, MD 04/29/23 8:31 AM

## 2023-04-29 NOTE — Discharge Instructions (Addendum)
 [facility] DISCHARGE INSTRUCTIONS  Activity No heavy lifting greater than 15 pounds for 2 weeks after surgery. Ok to shower in 24 hours, but do not bathe or submerge incisions underwater. Do not drive while taking narcotic pain medication. You may drive when you are no longer taking prescription pain medication, you can comfortably wear a seatbelt, and you can safely maneuver your car and apply brakes.  Wound Care Your incision is covered with skin glue called Dermabond. This will peel off on its own over time. You may shower and allow warm soapy water to run over your incision in 24 hours after surgery. Gently pat dry. Do not submerge your incision underwater for 2 weeks. Monitor your incision for any new redness, tenderness, or drainage. Many patients will experience some swelling and bruising at the incisions.  Ice packs will help.  Swelling and bruising can take several days to resolve.   Medications A  prescription for pain medication may be given to you upon discharge.  Take your pain medication as prescribed, if needed.  If narcotic pain medicine is not needed, then you may take acetaminophen  (Tylenol ) or ibuprofen  (Advil ) as needed. It is common to experience some constipation if taking pain medication after surgery.  Increasing fluid intake and taking a stool softener (such as Colace) will usually help or prevent this problem from occurring.  A mild laxative (Milk of Magnesia or Miralax) should be taken according to package directions if there are no bowel movements after 48 hours. Take your usually prescribed medications unless otherwise directed. If you need a refill on your pain medication, please contact your pharmacy.  They will contact our office to request authorization. Prescriptions will not be filled after 5 pm or on weekends.  When to Call Us : Fever greater than 100.5 New redness, drainage, or swelling at incision site Severe pain, nausea, or  vomiting Persistent bleeding from incisions  Follow-up You have an appointment scheduled with Dr. Dasie on May 24, 2023 at 1:50pm. This will be at the [facility] office at [address], [address], [place], [place]. Please arrive at least 15 minutes prior to your scheduled appointment time.  The clinic staff is available to answer your questions during regular business hours.  Please don't hesitate to call and ask to speak to one of the nurses for clinical concerns.  If you have a medical emergency, go to the nearest emergency room or call 911.  A surgeon from [facility] is always on call at the hospital  [address], [place], [place]  [address] ?  [address] [address], [place], [place]   [address] [phone] ? Toll Free: [phone] ? FAX [phone] Web site: [email or web address]      Managing Your Pain After Surgery Without Opioids    Thank you for participating in our program to help patients manage their pain after surgery without opioids. This is part of our effort to provide you with the best care possible, without exposing you or your family to the risk that opioids pose.  What pain can I expect after surgery? You can expect to have some pain after surgery. This is normal. The pain is typically worse the day after surgery, and quickly begins to get better. Many studies have found that many patients are able to manage their pain after surgery with Over-the-Counter (OTC) medications such as Tylenol  and Motrin . If you have a condition that does not allow you to take Tylenol   or Motrin , notify your surgical team.  How will I manage my pain? The best strategy for controlling your pain after surgery is around the clock pain control with Tylenol  (acetaminophen ) and Motrin  (ibuprofen  or Advil ). Alternating these medications with each other allows you to maximize your pain control. In addition to Tylenol  and Motrin , you can use  heating pads or ice packs on your incisions to help reduce your pain.  How will I alternate your regular strength over-the-counter pain medication? You will take a dose of pain medication every three hours. Start by taking 650 mg of Tylenol  (2 pills of 325 mg) 3 hours later take 600 mg of Motrin  (3 pills of 200 mg) 3 hours after taking the Motrin  take 650 mg of Tylenol  3 hours after that take 600 mg of Motrin .   - 1 -  See example - if your first dose of Tylenol  is at 12:00 PM   12:00 PM Tylenol  650 mg (2 pills of 325 mg)  3:00 PM Motrin  600 mg (3 pills of 200 mg)  6:00 PM Tylenol  650 mg (2 pills of 325 mg)  9:00 PM Motrin  600 mg (3 pills of 200 mg)  Continue alternating every 3 hours   We recommend that you follow this schedule around-the-clock for at least 3 days after surgery, or until you feel that it is no longer needed. Use the table on the last page of this handout to keep track of the medications you are taking. Important: Do not take more than 3000mg  of Tylenol  or 3200mg  of Motrin  in a 24-hour period. Do not take ibuprofen /Motrin  if you have a history of bleeding stomach ulcers, severe kidney disease, &/or actively taking a blood thinner  What if I still have pain? If you have pain that is not controlled with the over-the-counter pain medications (Tylenol  and Motrin  or Advil ) you might have what we call "breakthrough" pain. You will receive a prescription for a small amount of an opioid pain medication such as Oxycodone , Tramadol , or Tylenol  with Codeine. Use these opioid pills in the first 24 hours after surgery if you have breakthrough pain. Do not take more than 1 pill every 4-6 hours.  If you still have uncontrolled pain after using all opioid pills, don't hesitate to call our staff using the number provided. We will help make sure you are managing your pain in the best way possible, and if necessary, we can provide a prescription for additional pain medication.   Day 1     Time  Name of Medication Number of pills taken  Amount of Acetaminophen   Pain Level   Comments  AM PM       AM PM       AM PM       AM PM       AM PM       AM PM       AM PM       AM PM       Total Daily amount of Acetaminophen  Do not take more than  3,000 mg per day      Day 2    Time  Name of Medication Number of pills taken  Amount of Acetaminophen   Pain Level   Comments  AM PM       AM PM       AM PM       AM PM       AM PM  AM PM       AM PM       AM PM       Total Daily amount of Acetaminophen  Do not take more than  3,000 mg per day      Day 3    Time  Name of Medication Number of pills taken  Amount of Acetaminophen   Pain Level   Comments  AM PM       AM PM       AM PM       AM PM         AM PM       AM PM       AM PM       AM PM       Total Daily amount of Acetaminophen  Do not take more than  3,000 mg per day      Day 4    Time  Name of Medication Number of pills taken  Amount of Acetaminophen   Pain Level   Comments  AM PM       AM PM       AM PM       AM PM       AM PM       AM PM       AM PM       AM PM       Total Daily amount of Acetaminophen  Do not take more than  3,000 mg per day      Day 5    Time  Name of Medication Number of pills taken  Amount of Acetaminophen   Pain Level   Comments  AM PM       AM PM       AM PM       AM PM       AM PM       AM PM       AM PM       AM PM       Total Daily amount of Acetaminophen  Do not take more than  3,000 mg per day      Day 6    Time  Name of Medication Number of pills taken  Amount of Acetaminophen   Pain Level  Comments  AM PM       AM PM       AM PM       AM PM       AM PM       AM PM       AM PM       AM PM       Total Daily amount of Acetaminophen  Do not take more than  3,000 mg per day      Day 7    Time  Name of Medication Number of pills taken  Amount of Acetaminophen   Pain Level   Comments  AM PM       AM PM        AM PM       AM PM       AM PM       AM PM       AM PM       AM PM       Total Daily amount of Acetaminophen  Do not take more than  3,000 mg per day        For additional information about how and where  to safely dispose of unused opioid medications - [facility]ankcre[employer]  Disclaimer: This document contains information and/or instructional materials adapted from [facility] for the typical patient with your condition. It does not replace medical advice from your health care provider because your experience may differ from that of the typical patient. Talk to your health care provider if you have any questions about this document, your condition or your treatment plan. Adapted from [facility]

## 2023-04-30 ENCOUNTER — Encounter (HOSPITAL_COMMUNITY): Payer: Self-pay | Admitting: Surgery

## 2023-04-30 LAB — SURGICAL PATHOLOGY

## 2023-06-11 ENCOUNTER — Other Ambulatory Visit: Payer: Self-pay | Admitting: Family Medicine

## 2023-06-11 ENCOUNTER — Ambulatory Visit
Admission: RE | Admit: 2023-06-11 | Discharge: 2023-06-11 | Disposition: A | Discharge status: Home / Self Care | Payer: Medicare PPO | Source: Ambulatory Visit | Attending: Family Medicine

## 2023-06-11 DIAGNOSIS — M549 Dorsalgia, unspecified: Secondary | ICD-10-CM

## 2023-07-17 ENCOUNTER — Other Ambulatory Visit: Payer: Self-pay | Admitting: Family Medicine

## 2023-07-17 DIAGNOSIS — Z13820 Encounter for screening for osteoporosis: Secondary | ICD-10-CM

## 2023-07-19 ENCOUNTER — Other Ambulatory Visit: Payer: Self-pay | Admitting: Family Medicine

## 2023-07-19 DIAGNOSIS — Z0000 Encounter for general adult medical examination without abnormal findings: Secondary | ICD-10-CM

## 2023-07-29 ENCOUNTER — Ambulatory Visit

## 2023-08-06 ENCOUNTER — Ambulatory Visit
Admission: RE | Admit: 2023-08-06 | Discharge: 2023-08-06 | Disposition: A | Discharge status: Home / Self Care | Source: Ambulatory Visit | Attending: Family Medicine

## 2023-08-06 DIAGNOSIS — Z0000 Encounter for general adult medical examination without abnormal findings: Secondary | ICD-10-CM

## 2023-09-03 ENCOUNTER — Encounter: Payer: Self-pay | Admitting: Orthopaedic Surgery

## 2023-09-03 ENCOUNTER — Ambulatory Visit (INDEPENDENT_AMBULATORY_CARE_PROVIDER_SITE_OTHER): Admitting: Orthopaedic Surgery

## 2023-09-03 ENCOUNTER — Other Ambulatory Visit (INDEPENDENT_AMBULATORY_CARE_PROVIDER_SITE_OTHER): Payer: Self-pay

## 2023-09-03 DIAGNOSIS — M25561 Pain in right knee: Secondary | ICD-10-CM | POA: Diagnosis not present

## 2023-09-03 DIAGNOSIS — G8929 Other chronic pain: Secondary | ICD-10-CM

## 2023-09-03 MED ORDER — METHYLPREDNISOLONE ACETATE 40 MG/ML IJ SUSP
40.0000 mg | INTRAMUSCULAR | Status: AC | PRN
Start: 2023-09-03 — End: 2023-09-03
  Administered 2023-09-03: 40 mg via INTRA_ARTICULAR

## 2023-09-03 MED ORDER — LIDOCAINE HCL 1 % IJ SOLN
2.0000 mL | INTRAMUSCULAR | Status: AC | PRN
Start: 2023-09-03 — End: 2023-09-03
  Administered 2023-09-03: 2 mL

## 2023-09-03 MED ORDER — BUPIVACAINE HCL 0.5 % IJ SOLN
2.0000 mL | INTRAMUSCULAR | Status: AC | PRN
  Administered 2023-09-03: 2 mL via INTRA_ARTICULAR

## 2023-09-03 NOTE — Progress Notes (Signed)
 Office Visit Note   Patient: Tamara Odom           Date of Birth: 08-11-1945           MRN: [id number] Visit Date: 09/03/2023              Requested by: Lorella Roles, MD [address] [place],  [place] [address] PCP: Lorella Roles, MD   Assessment & Plan: Visit Diagnoses:  1. Chronic pain of right knee     Plan: History of Present Illness Tamara Odom is a 78 year old female with degenerative arthritis who presents with worsening right knee pain.  She has experienced right knee pain for several years, with a recent increase in severity due to increased walking. There have been no recent falls or weight changes. She received gel injections two years ago, which provided some relief but not significant improvement. Her sisters have undergone knee replacements and advised her against the procedure, though their reasons are unclear.  Physical Exam MUSCULOSKELETAL: Right knee varus deformity. Right knee medial joint line tenderness. No effusion in right knee. Right knee flexion 0 to 100 degrees. Right knee collateral ligaments stable.  Results RADIOLOGY Right knee X-ray: Varus deformity, bone-on-bone contact, severe degenerative arthritis, lateral tibial subluxation.  Assessment and Plan Degenerative arthritis of right knee Chronic degenerative arthritis with worsening pain and varus deformity. X-ray shows significant degeneration. Conservative treatments unlikely effective. Discussed surgery risks and benefits. She prefers cortisone injection first. - Administer cortisone injection to right knee. - Evaluate pain relief from injection to determine next steps.  Follow-Up Instructions: No follow-ups on file.   Orders:  Orders Placed This Encounter  Procedures   Large Joint Inj: R knee   XR KNEE 3 VIEW RIGHT   No orders of the defined types were placed in this encounter.     Procedures: Large Joint Inj: R knee on 09/03/2023 11:28 AM Indications: pain Details: 22  G needle  Arthrogram: No  Medications: 40 mg methylPREDNISolone acetate 40 MG/ML; 2 mL lidocaine  1 %; 2 mL bupivacaine  0.5 % Consent was given by the patient. Patient was prepped and draped in the usual sterile fashion.      Subjective: Chief Complaint  Patient presents with   Right Knee - Pain    HPI  Review of Systems  Constitutional: Negative.   HENT: Negative.    Eyes: Negative.   Respiratory: Negative.    Cardiovascular: Negative.   Endocrine: Negative.   Musculoskeletal: Negative.   Neurological: Negative.   Hematological: Negative.   Psychiatric/Behavioral: Negative.    All other systems reviewed and are negative.    Objective: Vital Signs: There were no vitals taken for this visit.  Physical Exam Vitals and nursing note reviewed.  Constitutional:      Appearance: She is well-developed.  HENT:     Head: Atraumatic.     Nose: Nose normal.  Eyes:     Extraocular Movements: Extraocular movements intact.  Cardiovascular:     Pulses: Normal pulses.  Pulmonary:     Effort: Pulmonary effort is normal.  Abdominal:     Palpations: Abdomen is soft.  Musculoskeletal:     Cervical back: Neck supple.  Skin:    General: Skin is warm.     Capillary Refill: Capillary refill takes less than 2 seconds.  Neurological:     Mental Status: She is alert. Mental status is at baseline.  Psychiatric:        Behavior: Behavior normal.  Thought Content: Thought content normal.        Judgment: Judgment normal.   Imaging: XR KNEE 3 VIEW RIGHT Result Date: 09/03/2023 X-rays of the right knee show bone-on-bone medial compartment with varus deformity.  Slight lateral subluxation of the tibia.    PMFS History: Patient Active Problem List   Diagnosis Date Noted   Right knee injury 04/03/2012   Left knee injury 04/03/2012   Elbow pain 04/03/2012   Past Medical History:  Diagnosis Date   Anemia    Anxiety    Arthritis    High cholesterol    Hypertension     PONV (postoperative nausea and vomiting)    Pre-diabetes    Sleep apnea    Tuberculosis    Had TB approximately 27 years ago    Family History  Problem Relation Age of Onset   Heart attack Father    Hypertension Father    Breast cancer Sister    Diabetes Sister    Hypertension Sister    Hyperlipidemia Neg Hx    Sudden death Neg Hx     Past Surgical History:  Procedure Laterality Date   BREAST CYST ASPIRATION Right    CESAREAN SECTION     x 2   EYE SURGERY Bilateral    Cataract extraction   LIPOMA EXCISION N/A 04/29/2023   Procedure: BACK LIPOMA EXCISION;  Surgeon: Lujean Sake, MD;  Location: [facility];  Service: General;  Laterality: N/A;   Social History   Occupational History   Not on file  Tobacco Use   Smoking status: Never   Smokeless tobacco: Never  Vaping Use   Vaping status: Never Used  Substance and Sexual Activity   Alcohol use: No   Drug use: No   Sexual activity: Not Currently

## 2023-10-26 IMAGING — MG MM DIGITAL SCREENING BILAT W/ TOMO AND CAD
8 series · 8 of 24 positions shown · non-contrast
Comparison: Previous exam(s).

CLINICAL DATA: Screening.

EXAM:
DIGITAL SCREENING BILATERAL MAMMOGRAM WITH TOMOSYNTHESIS AND CAD
TECHNIQUE: Bilateral screening digital craniocaudal and mediolateral oblique
mammograms were obtained. Bilateral screening digital breast
tomosynthesis was performed. The images were evaluated with
computer-aided detection.

[L CC synth-2D]
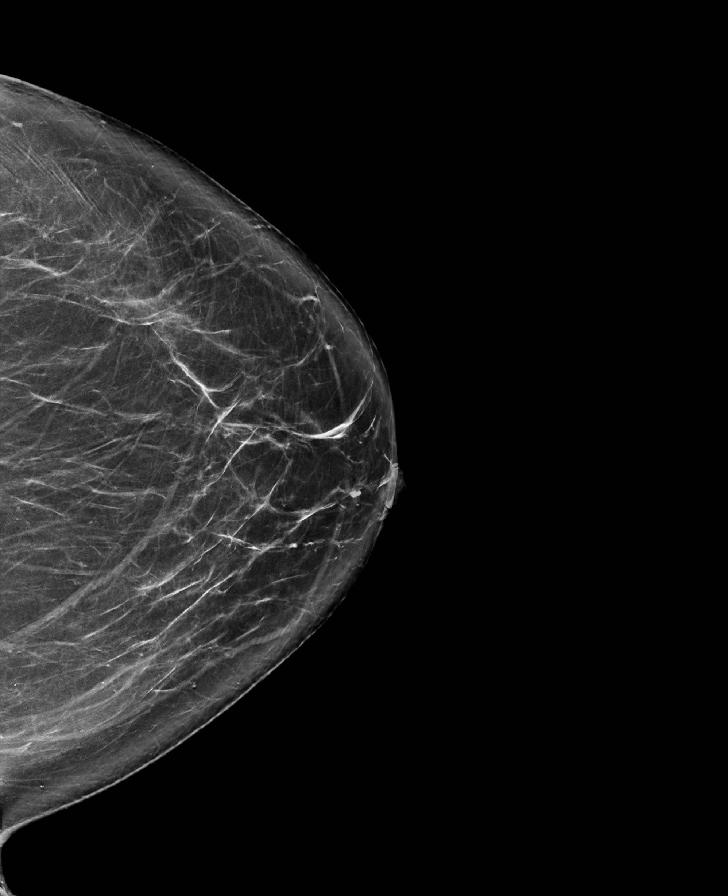

[R CC synth-2D]
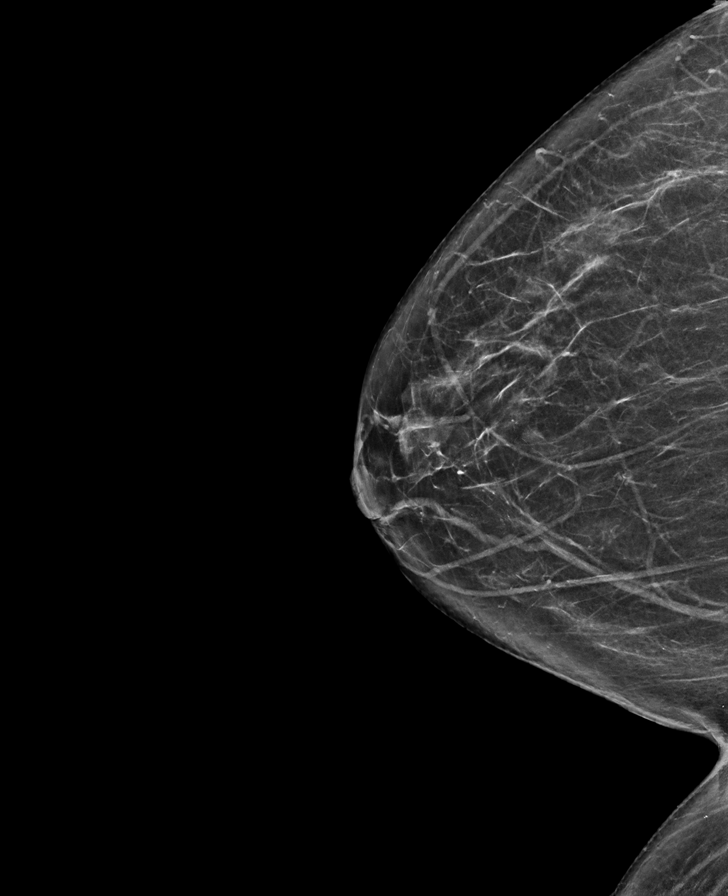

[L MLO synth-2D]
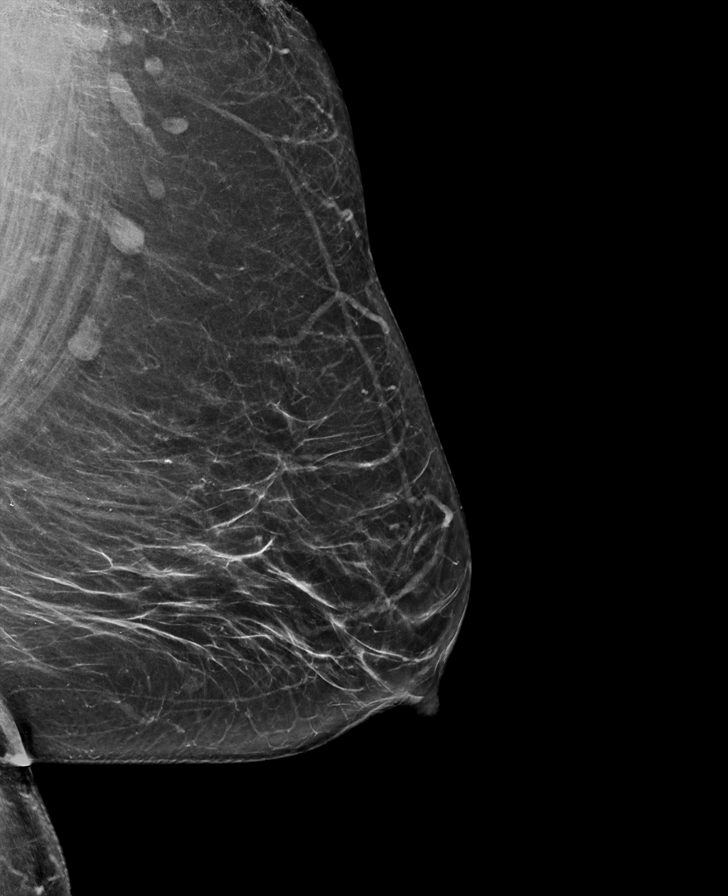

[R MLO synth-2D]
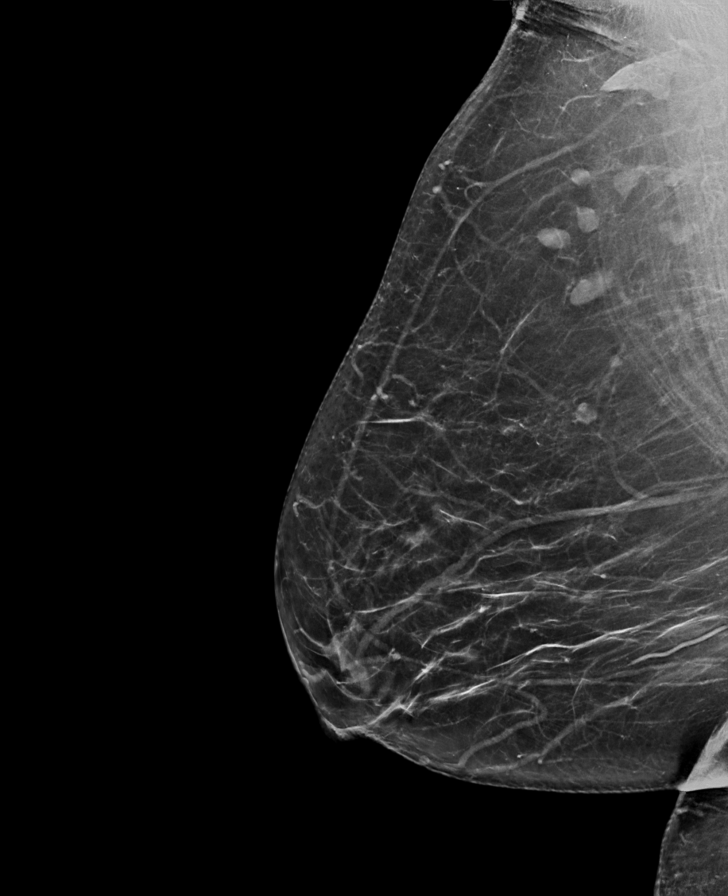

[R MLO tomo · tomo slice 34/67.0]
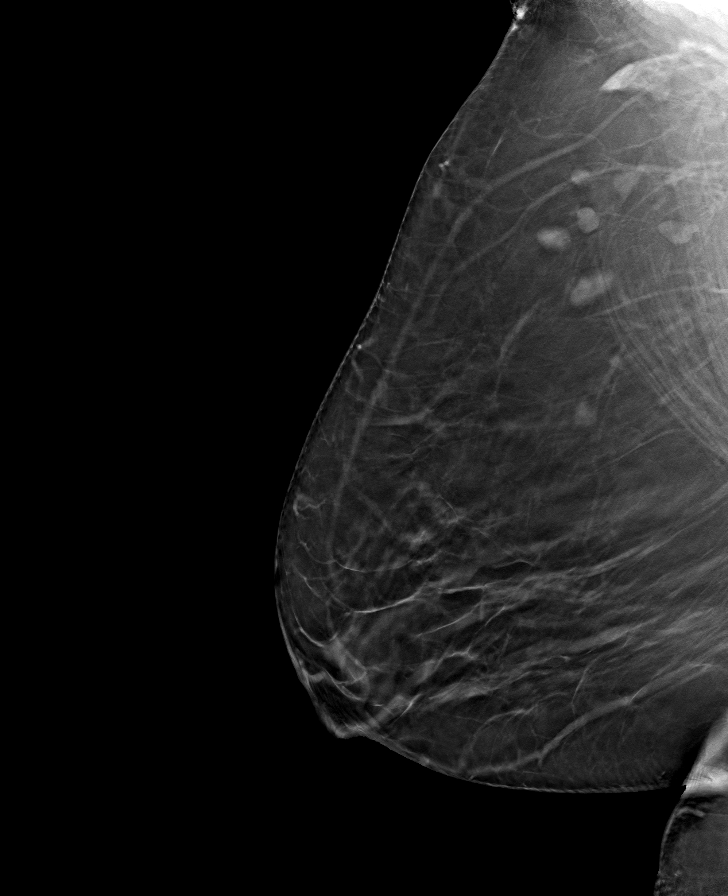

[L CC tomo · tomo slice 39/76.0]
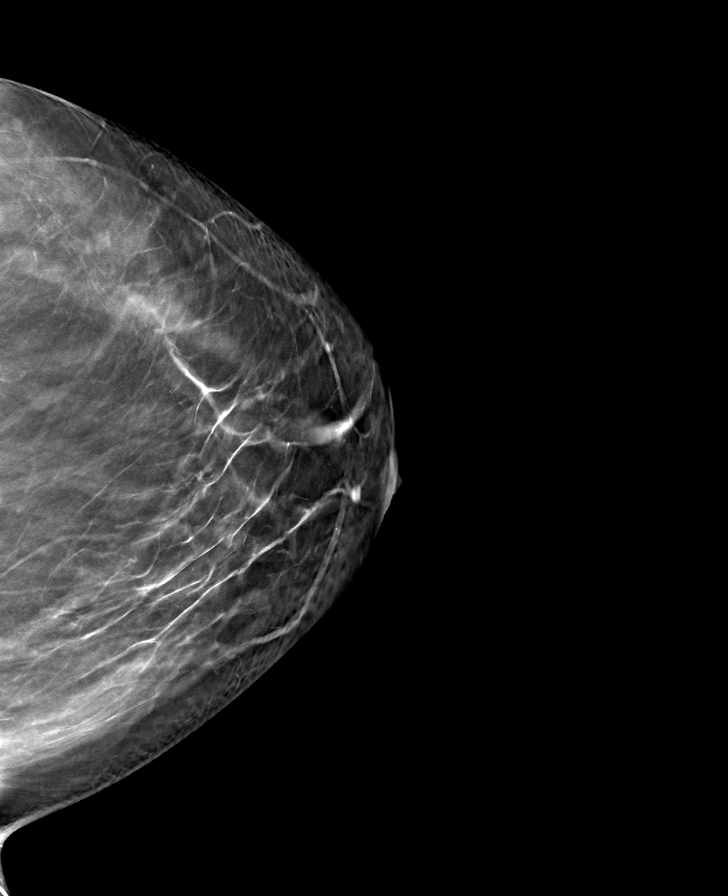

[L MLO tomo · tomo slice 37/72.0]
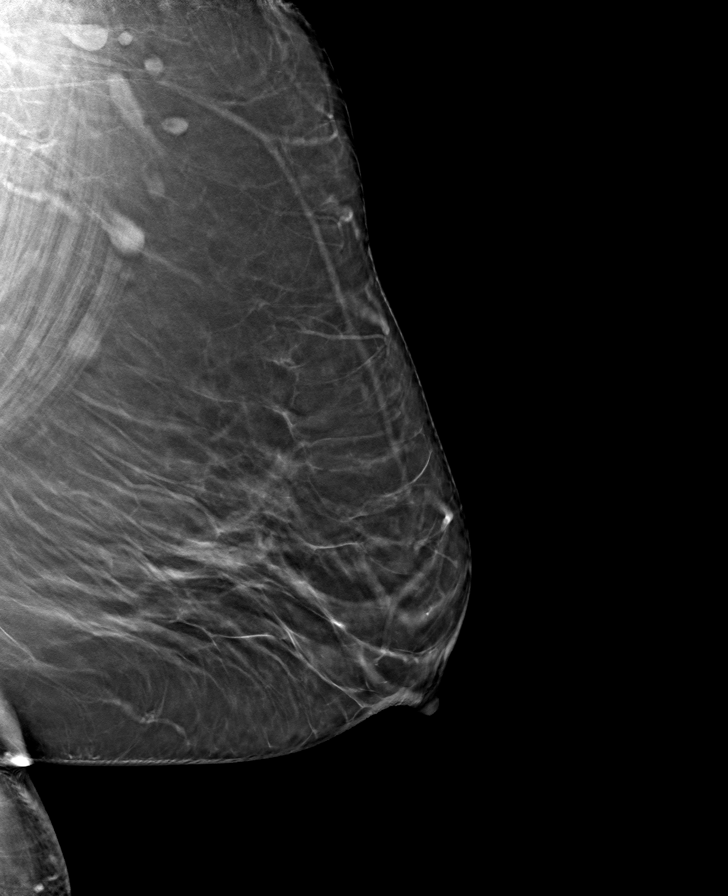

[R CC tomo · tomo slice 33/66.0]
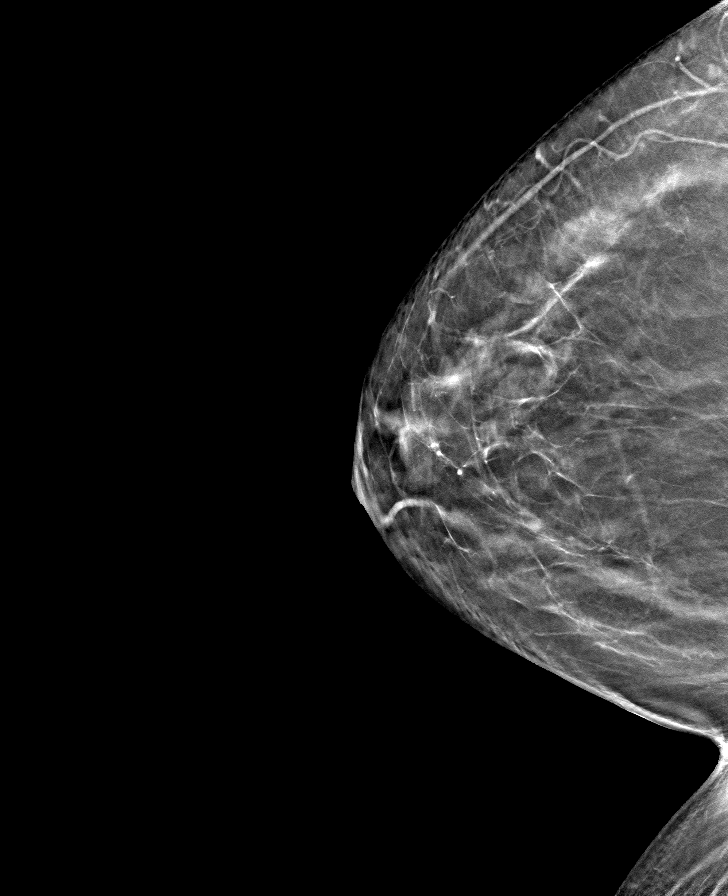

[8 of 24 positions shown; findings below may reference images not displayed]

ACR Breast Density Category b: There are scattered areas of
fibroglandular density.
FINDINGS: There are no findings suspicious for malignancy.
IMPRESSION: No mammographic evidence of malignancy. A result letter of this
screening mammogram will be mailed directly to the patient.

RECOMMENDATION:
Screening mammogram in one year. (Code:51-O-LD2)

BI-RADS CATEGORY  1: Negative.

## 2023-11-19 ENCOUNTER — Ambulatory Visit
Admission: RE | Admit: 2023-11-19 | Discharge: 2023-11-19 | Disposition: A | Discharge status: Home / Self Care | Source: Ambulatory Visit | Attending: Family Medicine | Admitting: Family Medicine

## 2023-11-19 VITALS — BP 146/90 | HR 102 | Temp 98.6°F | Resp 18

## 2023-11-19 DIAGNOSIS — R300 Dysuria: Secondary | ICD-10-CM | POA: Insufficient documentation

## 2023-11-19 DIAGNOSIS — N3001 Acute cystitis with hematuria: Secondary | ICD-10-CM | POA: Insufficient documentation

## 2023-11-19 LAB — POCT URINE DIPSTICK
Bilirubin, UA: NEGATIVE
Glucose, UA: NEGATIVE mg/dL
Ketones, POC UA: NEGATIVE mg/dL
Nitrite, UA: NEGATIVE
POC PROTEIN,UA: NEGATIVE
Spec Grav, UA: <=1.005 — AB (ref 1.010–1.025)
Urobilinogen, UA: 0.2 U/dL
pH, UA: 5.5 (ref 5.0–8.0)

## 2023-11-19 MED ORDER — CEPHALEXIN 500 MG PO CAPS: 500.0000 mg | ORAL_CAPSULE | Freq: Two times a day (BID) | ORAL | 0 refills | Status: AC

## 2023-11-19 NOTE — Discharge Instructions (Signed)
Please start cephalexin to address an urinary tract infection. Make sure you hydrate very well with plain water and a quantity of 64 ounces of water a day.  Please limit drinks that are considered urinary irritants such as soda, sweet tea, coffee, energy drinks, alcohol.  These can worsen your urinary and genital symptoms but also be the source of them.  I will let you know about your urine culture results through MyChart to see if we need to prescribe or change your antibiotics based off of those results.

## 2023-11-19 NOTE — ED Triage Notes (Addendum)
 Patient presents with dysuria symptoms that started x 3 days ago. Denies any other symptoms at this time.  Home Intervention: None

## 2023-11-19 NOTE — ED Provider Notes (Signed)
 [facility]  Note:  This document was prepared using Conservation officer, historic buildings and may include unintentional dictation errors.  MRN: [id number] DOB: August 09, 1945  Subjective:   Tamara Odom is a 78 y.o. female presenting for 3 day history of dysuria, cloudy and dark urine, malodorous urine. Denies fever, n/v, abdominal pain, pelvic pain, rashes, urinary frequency, hematuria, vaginal discharge.  Hydrates some with water. Also drinks 1 cup of coffee, has sweet tea a few times a week. Has remote history of UTI. Has history of renal stones.   No current facility-administered medications for this encounter.  Current Outpatient Medications:    acetaminophen  (TYLENOL ) 650 MG CR tablet, Take 325 mg by mouth daily., Disp: , Rfl:    lisinopril-hydrochlorothiazide (ZESTORETIC) 20-12.5 MG tablet, Take 1 tablet by mouth daily., Disp: , Rfl:    Multiple Vitamins-Minerals (HAIR SKIN & NAILS PO), Take 1 tablet by mouth daily., Disp: , Rfl:    alum & mag hydroxide-simeth (MAALOX MAX) 400-400-40 MG/5ML suspension, Take 15 mLs by mouth every 6 (six) hours as needed for indigestion. (Patient not taking: Reported on 03/28/2023), Disp: 355 mL, Rfl: 0   ondansetron  (ZOFRAN ) 4 MG tablet, Take 1 tablet (4 mg total) by mouth every 6 (six) hours. 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     Past Surgical History:  Procedure Laterality Date   BREAST CYST ASPIRATION Right    CESAREAN SECTION     x 2   EYE SURGERY Bilateral    Cataract extraction   LIPOMA EXCISION N/A 04/29/2023   Procedure: BACK LIPOMA EXCISION;   Surgeon: Dasie Leonor CROME, MD;  Location: [facility];  Service: General;  Laterality: N/A;    Family History  Problem Relation Age of Onset   Heart attack Father    Hypertension Father    Breast cancer Sister    Diabetes Sister    Hypertension Sister    Hyperlipidemia Neg Hx    Sudden death Neg Hx     Social History   Tobacco Use   Smoking status: Never   Smokeless tobacco: Never  Vaping Use   Vaping status: Never Used  Substance Use Topics   Alcohol use: No   Drug use: No    ROS   Objective:   Vitals: BP (!) 146/90 (BP Location: Left Arm)   Pulse (!) 102   Temp 98.6 F (37 C) (Oral)   Resp 18   SpO2 95%   Physical Exam Constitutional:      General: She is not in acute distress.    Appearance: Normal appearance. She is well-developed. She is not ill-appearing, toxic-appearing or diaphoretic.  HENT:     Head: Normocephalic and atraumatic.     Nose: Nose normal.     Mouth/Throat:     Mouth: Mucous membranes are moist.     Pharynx: Oropharynx is clear.  Eyes:     General: No scleral icterus.       Right eye: No discharge.  Left eye: No discharge.     Extraocular Movements: Extraocular movements intact.     Conjunctiva/sclera: Conjunctivae normal.  Cardiovascular:     Rate and Rhythm: Normal rate.  Pulmonary:     Effort: Pulmonary effort is normal.  Abdominal:     General: Bowel sounds are normal. There is no distension.     Palpations: Abdomen is soft. There is no mass.     Tenderness: There is no abdominal tenderness. There is no right CVA tenderness, left CVA tenderness, guarding or rebound.  Skin:    General: Skin is warm and dry.  Neurological:     General: No focal deficit present.     Mental Status: She is alert and oriented to person, place, and time.  Psychiatric:        Mood and Affect: Mood normal.        Behavior: Behavior normal.        Thought Content: Thought content normal.        Judgment: Judgment normal.     Results for  orders placed or performed during the hospital encounter of 11/19/23 (from the past 24 hours)  POCT URINE DIPSTICK     Status: Abnormal   Collection Time: 11/19/23  2:35 PM  Result Value Ref Range   Color, UA yellow yellow   Clarity, UA hazy (A) clear   Glucose, UA negative negative mg/dL   Bilirubin, UA negative negative   Ketones, POC UA negative negative mg/dL   Spec Grav, UA <=8.994 (A) 1.010 - 1.025   Blood, UA trace-intact (A) negative   pH, UA 5.5 5.0 - 8.0   POC PROTEIN,UA negative negative, trace   Urobilinogen, UA 0.2 0.2 or 1.0 E.U./dL   Nitrite, UA Negative Negative   Leukocytes, UA Small (1+) (A) Negative    Assessment and Plan :   PDMP not reviewed this encounter.  1. Acute cystitis with hematuria   2. Dysuria      Start Keflex  to cover for acute cystitis, urine culture pending.  Recommended aggressive hydration, limiting urinary irritants. Counseled patient on potential for adverse effects with medications prescribed/recommended today, ER and return-to-clinic precautions discussed, patient verbalized understanding.    Christopher Savannah, [place] 11/19/23 1438

## 2023-11-21 ENCOUNTER — Ambulatory Visit (HOSPITAL_COMMUNITY): Payer: Self-pay

## 2023-11-21 LAB — URINE CULTURE: Culture: 30000 — AB

## 2024-02-24 ENCOUNTER — Encounter: Payer: Self-pay | Admitting: Radiology

## 2024-02-26 ENCOUNTER — Ambulatory Visit: Admission: EM | Admit: 2024-02-26 | Discharge: 2024-02-26 | Disposition: A | Discharge status: Home / Self Care

## 2024-02-26 ENCOUNTER — Encounter: Payer: Self-pay | Admitting: Emergency Medicine

## 2024-02-26 DIAGNOSIS — M1711 Unilateral primary osteoarthritis, right knee: Secondary | ICD-10-CM

## 2024-02-26 DIAGNOSIS — Z794 Long term (current) use of insulin: Secondary | ICD-10-CM | POA: Insufficient documentation

## 2024-02-26 DIAGNOSIS — M25561 Pain in right knee: Secondary | ICD-10-CM | POA: Diagnosis not present

## 2024-02-26 DIAGNOSIS — G8929 Other chronic pain: Secondary | ICD-10-CM | POA: Diagnosis not present

## 2024-02-26 DIAGNOSIS — I10 Essential (primary) hypertension: Secondary | ICD-10-CM | POA: Insufficient documentation

## 2024-02-26 DIAGNOSIS — E1165 Type 2 diabetes mellitus with hyperglycemia: Secondary | ICD-10-CM | POA: Insufficient documentation

## 2024-02-26 DIAGNOSIS — E785 Hyperlipidemia, unspecified: Secondary | ICD-10-CM | POA: Insufficient documentation

## 2024-02-26 DIAGNOSIS — E66811 Obesity, class 1: Secondary | ICD-10-CM | POA: Insufficient documentation

## 2024-02-26 MED ORDER — KETOROLAC TROMETHAMINE 30 MG/ML IJ SOLN
30.0000 mg | Freq: Once | INTRAMUSCULAR | Status: AC
Start: 2024-02-26 — End: 2024-02-26
  Administered 2024-02-26: 30 mg via INTRAMUSCULAR

## 2024-02-26 MED ORDER — DEXAMETHASONE 10 MG/ML FOR PEDIATRIC ORAL USE
10.0000 mg | Freq: Once | INTRAMUSCULAR | Status: AC
  Administered 2024-02-26: 10 mg via ORAL

## 2024-02-26 MED ORDER — MELOXICAM 7.5 MG PO TABS: 7.5000 mg | ORAL_TABLET | Freq: Once | ORAL | 0 refills | Status: AC | PRN

## 2024-02-26 NOTE — ED Provider Notes (Signed)
 [facility]    CSN: [id number] Arrival date & time: 02/26/24  1307      History   Chief Complaint Chief Complaint  Patient presents with   Fall   Knee Pain    HPI Tamara Odom is a 78 y.o. female.   Patient with a history of arthritis in her right knee presents today due to increased knee pain for the past 2 weeks after fall.  Patient is able to bear weight on right lower extremity but states that ambulation is painful.  Patient states she has been using Tylenol  with mild temporary relief of pain.  Patient states she has not been using ibuprofen  because it hurts her stomach.  Patient states that she has not taken Tylenol  in several years.  The history is provided by the patient.  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    Patient Active Problem List   Diagnosis Date Noted   Class 1 obesity 02/26/2024   Benign essential hypertension 02/26/2024   Hyperglycemia due to type 2 diabetes mellitus (HCC) 02/26/2024   Hyperlipidemia 02/26/2024   Long term current use of insulin (HCC) 02/26/2024   Pain in right knee 01/20/2020   Right knee injury 04/03/2012   Left knee injury 04/03/2012   Elbow pain 04/03/2012    Past Surgical History:  Procedure Laterality Date   BREAST CYST ASPIRATION Right    CESAREAN SECTION     x 2   EYE SURGERY Bilateral    Cataract extraction   LIPOMA EXCISION N/A 04/29/2023   Procedure: BACK LIPOMA EXCISION;  Surgeon: Dasie Leonor CROME, MD;  Location: [facility];  Service: General;  Laterality: N/A;    OB History   No obstetric history on file.      Home Medications    Prior to Admission medications   Medication Sig Start Date End Date Taking? Authorizing Provider  acetaminophen  (TYLENOL ) 650 MG CR tablet Take  325 mg by mouth daily.   Yes [provider]  lisinopril-hydrochlorothiazide (ZESTORETIC) 20-12.5 MG tablet Take 1 tablet by mouth daily. 04/15/19  Yes [provider]  meloxicam  (MOBIC ) 7.5 MG tablet Take 1 tablet (7.5 mg total) by mouth once as needed for pain. 02/26/24  Yes Andra Krabbe C, PA-C  Multiple Vitamins-Minerals (HAIR SKIN & NAILS PO) Take 1 tablet by mouth daily.   Yes [provider]  alum & mag hydroxide-simeth (MAALOX MAX) 400-400-40 MG/5ML suspension Take 15 mLs by mouth every 6 (six) hours as needed for indigestion. Patient not taking: Reported on 03/28/2023 01/30/23   Countryman, Chase, MD  Biotin 5 MG CAPS 1 capsule. Patient not taking: Reported on 02/26/2024    [provider]  cephALEXin  (KEFLEX ) 500 MG capsule Take 1 capsule (500 mg total) by mouth 2 (two) times daily. Patient not taking: Reported on 02/26/2024 11/19/23   Christopher Savannah, PA-C  Cyanocobalamin (VITAMIN B12) 1000 MCG TBCR 1 tablet Orally Once a day; Duration: 30 day(s) Patient not taking: Reported on 02/26/2024    [provider]  doxycycline (MONODOX) 100 MG capsule Take 100 mg by mouth 2 (two) times daily. Patient not taking: Reported on 02/26/2024 01/24/24   [provider]  Evolocumab (REPATHA) 140 MG/ML SOSY 1 ml Subcutaneous q 2 weeks; Duration: 30 day(s) Patient not taking: Reported on 02/26/2024    [provider]  omeprazole (PRILOSEC) 40 MG capsule Take 40 mg by mouth daily. Patient not taking: Reported on 02/26/2024 02/20/24   [provider]  ondansetron  (ZOFRAN ) 4 MG tablet Take 1 tablet (4 mg total) by mouth every 6 (six) hours. Patient not taking: Reported on 03/28/2023 01/30/23   Jerral Meth, MD  promethazine-dextromethorphan (PROMETHAZINE-DM) 6.25-15 MG/5ML syrup SMARTSIG:10 Milliliter(s) By Mouth Every 8 Hours PRN Patient not taking: Reported on 02/26/2024 01/24/24   [provider]  rosuvastatin (CRESTOR) 10 MG  tablet Take 10 mg by mouth once a week. Patient not taking: Reported on 02/26/2024 02/13/24   [provider]    Family History Family History  Problem Relation Age of Onset   Heart attack Father    Hypertension Father    Breast cancer Sister    Diabetes Sister    Hypertension Sister    Hyperlipidemia Neg Hx    Sudden death Neg Hx     Social History Social History   Tobacco Use   Smoking status: Never   Smokeless tobacco: Never  Vaping Use   Vaping status: Never Used  Substance Use Topics   Alcohol use: No   Drug use: No     Allergies   Bempedoic acid, Morphine, Morphine and codeine, and Statins   Review of Systems Review of Systems   Physical Exam Triage Vital Signs ED Triage Vitals [02/26/24 1324]  Encounter Vitals Group     BP 130/81     Girls Systolic BP Percentile      Girls Diastolic BP Percentile      Boys Systolic BP Percentile      Boys Diastolic BP Percentile      Pulse Rate 89     Resp 16     Temp 98 F (36.7 C)     Temp Source Oral     SpO2 96 %     Weight      Height      Head Circumference      Peak Flow      Pain Score 5     Pain Loc      Pain Education      Exclude from Growth Chart    No data found.  Updated Vital Signs BP 130/81 (BP Location: Left Arm)   Pulse 89   Temp 98 F (36.7 C) (Oral)   Resp 16   SpO2 96%   Visual Acuity Right Eye Distance:   Left Eye Distance:   Bilateral Distance:    Right Eye Near:   Left Eye Near:    Bilateral Near:     Physical Exam Vitals and nursing note reviewed.  Constitutional:      General: She is not in acute distress.    Appearance: Normal appearance. She is not ill-appearing, toxic-appearing or diaphoretic.  Eyes:     General: No scleral icterus. Cardiovascular:     Rate and Rhythm: Normal rate and regular rhythm.     Heart sounds: Normal heart sounds.  Pulmonary:     Effort: Pulmonary effort is normal. No respiratory distress.     Breath sounds: Normal breath  sounds. No wheezing or rhonchi.  Musculoskeletal:     Right knee: Swelling present. No deformity, effusion, erythema, ecchymosis or lacerations. Decreased range of motion (reduced flexion). No tenderness.  Comments: Pain elicited with active ROM  Skin:    General: Skin is warm.  Neurological:     Mental Status: She is alert and oriented to person, place, and time.  Psychiatric:        Mood and Affect: Mood normal.        Behavior: Behavior normal.      UC Treatments / Results  Labs (all labs ordered are listed, but only abnormal results are displayed) Labs Reviewed - No data to display  EKG   Radiology No results found.  Procedures Procedures (including critical care time)  Medications Ordered in UC Medications  ketorolac  (TORADOL ) 30 MG/ML injection 30 mg (30 mg Intramuscular Given 02/26/24 1408)  dexamethasone  (DECADRON ) 10 MG/ML injection for Pediatric ORAL use 10 mg (10 mg Oral Given 02/26/24 1408)    Initial Impression / Assessment and Plan / UC Course  I have reviewed the triage vital signs and the nursing notes.  Pertinent labs & imaging results that were available during my care of the patient were reviewed by me and considered in my medical decision making (see chart for details).      Final Clinical Impressions(s) / UC Diagnoses   Final diagnoses:  Chronic pain of right knee  Arthritis of right knee   Discharge Instructions   None    ED Prescriptions     Medication Sig Dispense Auth. Provider   meloxicam  (MOBIC ) 7.5 MG tablet Take 1 tablet (7.5 mg total) by mouth once as needed for pain. 30 tablet Andra Corean BROCKS, PA-C      PDMP not reviewed this encounter.   Andra Corean BROCKS, PA-C 02/26/24 1424

## 2024-02-26 NOTE — ED Triage Notes (Addendum)
 Pt reports fall that occurred 2 weeks ago. States she was going to a MD appt when she slid going up a hill on wet grass - walking up to office. Landed on her L side - reports pain in ribcage area. No bruising or swelling. States she broke a rib on that side last year. Pt has chronic R knee pain and states it has also flared up since that fall. Denies swelling or bruising. Able to ambulate, but has increased pain since fall. She thought it would improve, but it has not. She has not seen any providers regarding the fall. Only taking tylenol  with no relief. No ice or heat. Pain is affecting her sleep.

## 2024-02-26 NOTE — Progress Notes (Shared)
 [facility] - Clinic Note  03/04/2024   CHIEF COMPLAINT Patient presents for No chief complaint on file.  HISTORY OF PRESENT ILLNESS: Tamara Odom is a 78 y.o. female who presents to the clinic today for:   Referring physician: Haze Kingfisher, MD [address] [place],  [place] [address]  HISTORICAL INFORMATION:  Selected notes from the MEDICAL RECORD NUMBER Referred by Dr. JAMA:  Ocular Hx- PMH-   CURRENT MEDICATIONS: No current outpatient medications on file. (Ophthalmic Drugs)   No current facility-administered medications for this visit. (Ophthalmic Drugs)   Current Outpatient Medications (Other)  Medication Sig   acetaminophen  (TYLENOL ) 650 MG CR tablet Take 325 mg by mouth daily.   alum & mag hydroxide-simeth (MAALOX MAX) 400-400-40 MG/5ML suspension Take 15 mLs by mouth every 6 (six) hours as needed for indigestion. (Patient not taking: Reported on 03/28/2023)   cephALEXin  (KEFLEX ) 500 MG capsule Take 1 capsule (500 mg total) by mouth 2 (two) times daily.   lisinopril-hydrochlorothiazide (ZESTORETIC) 20-12.5 MG tablet Take 1 tablet by mouth daily.   Multiple Vitamins-Minerals (HAIR SKIN & NAILS PO) Take 1 tablet by mouth daily.   ondansetron  (ZOFRAN ) 4 MG tablet Take 1 tablet (4 mg total) by mouth every 6 (six) hours. (Patient not taking: Reported on 03/28/2023)   No current facility-administered medications for this visit. 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   Past Surgical History:  Procedure Laterality Date   BREAST CYST ASPIRATION Right    CESAREAN SECTION     x 2   EYE SURGERY  Bilateral    Cataract extraction   LIPOMA EXCISION N/A 04/29/2023   Procedure: BACK LIPOMA EXCISION;  Surgeon: Dasie Leonor CROME, MD;  Location: [facility];  Service: General;  Laterality: N/A;   FAMILY HISTORY Family History  Problem Relation Age of Onset   Heart attack Father    Hypertension Father    Breast cancer Sister    Diabetes Sister    Hypertension Sister    Hyperlipidemia Neg Hx    Sudden death Neg Hx    SOCIAL HISTORY Social History   Tobacco Use   Smoking status: Never   Smokeless tobacco: Never  Vaping Use   Vaping status: Never Used  Substance Use Topics   Alcohol use: No   Drug use: No       OPHTHALMIC EXAM:  Not recorded    IMAGING AND PROCEDURES  Imaging and Procedures for 03/04/2024        ASSESSMENT/PLAN: No diagnosis found. 1.  2.  3.  Ophthalmic Meds Ordered this visit:  No orders of the defined types were placed in this encounter.    No follow-ups on file.  There are no Patient Instructions on file for this visit.  Explained the diagnoses, plan, and follow up with the patient and they expressed understanding.  Patient expressed understanding of the importance of proper follow up care.   This document serves as a record of services personally performed by Redell JUDITHANN Hans, MD, PhD. It  was created on their behalf by Almetta Pesa, an ophthalmic technician. The creation of this record is the provider's dictation and/or activities during the visit.    Electronically signed by: Almetta Pesa, OA, 02/26/24  9:31 AM   Redell JUDITHANN Hans, M.D., Ph.D. Diseases & Surgery of the Retina and Vitreous [facility] 03/04/2024  Abbreviations: M myopia (nearsighted); A astigmatism; H hyperopia (farsighted); P presbyopia; Mrx spectacle prescription;  CTL contact lenses; OD right eye; OS left eye; OU both eyes  XT exotropia; ET esotropia; PEK punctate epithelial keratitis; PEE punctate epithelial erosions; DES dry eye syndrome;  MGD meibomian gland dysfunction; ATs artificial tears; PFAT's preservative free artificial tears; NSC nuclear sclerotic cataract; PSC posterior subcapsular cataract; ERM epi-retinal membrane; PVD posterior vitreous detachment; RD retinal detachment; DM diabetes mellitus; DR diabetic retinopathy; NPDR non-proliferative diabetic retinopathy; PDR proliferative diabetic retinopathy; CSME clinically significant macular edema; DME diabetic macular edema; dbh dot blot hemorrhages; CWS cotton wool spot; POAG primary open angle glaucoma; C/D cup-to-disc ratio; HVF humphrey visual field; GVF goldmann visual field; OCT optical coherence tomography; IOP intraocular pressure; BRVO Branch retinal vein occlusion; CRVO central retinal vein occlusion; CRAO central retinal artery occlusion; BRAO branch retinal artery occlusion; RT retinal tear; SB scleral buckle; PPV pars plana vitrectomy; VH Vitreous hemorrhage; PRP panretinal laser photocoagulation; IVK intravitreal kenalog; VMT vitreomacular traction; MH Macular hole;  NVD neovascularization of the disc; NVE neovascularization elsewhere; AREDS age related eye disease study; ARMD age related macular degeneration; POAG primary open angle glaucoma; EBMD epithelial/anterior basement membrane dystrophy; ACIOL anterior chamber intraocular lens; IOL intraocular lens; PCIOL posterior chamber intraocular lens; Phaco/IOL phacoemulsification with intraocular lens placement; PRK photorefractive keratectomy; LASIK laser assisted in situ keratomileusis; HTN hypertension; DM diabetes mellitus; COPD chronic obstructive pulmonary disease

## 2024-03-04 ENCOUNTER — Encounter (INDEPENDENT_AMBULATORY_CARE_PROVIDER_SITE_OTHER): Admitting: Ophthalmology

## 2024-03-04 DIAGNOSIS — H3581 Retinal edema: Secondary | ICD-10-CM

## 2024-03-05 NOTE — Progress Notes (Shared)
 [facility] - Clinic Note  03/06/2024   CHIEF COMPLAINT Patient presents for No chief complaint on file.  HISTORY OF PRESENT ILLNESS: Tamara Odom is a 78 y.o. female who presents to the clinic today for:   Referring physician: Haze Kingfisher, MD [address] [place],  [place] [address]  HISTORICAL INFORMATION:  Selected notes from the MEDICAL RECORD NUMBER Referred by Dr. JAMA:  Ocular Hx- PMH-   CURRENT MEDICATIONS: No current outpatient medications on file. (Ophthalmic Drugs)   No current facility-administered medications for this visit. (Ophthalmic Drugs)   Current Outpatient Medications (Other)  Medication Sig   acetaminophen  (TYLENOL ) 650 MG CR tablet Take 325 mg by mouth daily.   alum & mag hydroxide-simeth (MAALOX MAX) 400-400-40 MG/5ML suspension Take 15 mLs by mouth every 6 (six) hours as needed for indigestion. (Patient not taking: Reported on 03/28/2023)   Biotin 5 MG CAPS 1 capsule. (Patient not taking: Reported on 02/26/2024)   cephALEXin  (KEFLEX ) 500 MG capsule Take 1 capsule (500 mg total) by mouth 2 (two) times daily. (Patient not taking: Reported on 02/26/2024)   Cyanocobalamin (VITAMIN B12) 1000 MCG TBCR 1 tablet Orally Once a day; Duration: 30 day(s) (Patient not taking: Reported on 02/26/2024)   doxycycline (MONODOX) 100 MG capsule Take 100 mg by mouth 2 (two) times daily. (Patient not taking: Reported on 02/26/2024)   Evolocumab (REPATHA) 140 MG/ML SOSY 1 ml Subcutaneous q 2 weeks; Duration: 30 day(s) (Patient not taking: Reported on 02/26/2024)   lisinopril-hydrochlorothiazide (ZESTORETIC) 20-12.5 MG tablet Take 1 tablet by mouth daily.   meloxicam  (MOBIC ) 7.5 MG tablet Take 1 tablet (7.5 mg total) by mouth once as needed for pain.   Multiple Vitamins-Minerals (HAIR SKIN & NAILS PO) Take 1 tablet by mouth daily.   omeprazole (PRILOSEC) 40 MG capsule Take 40 mg by mouth daily. (Patient not taking: Reported on 02/26/2024)   ondansetron   (ZOFRAN ) 4 MG tablet Take 1 tablet (4 mg total) by mouth every 6 (six) hours. (Patient not taking: Reported on 03/28/2023)   promethazine-dextromethorphan (PROMETHAZINE-DM) 6.25-15 MG/5ML syrup SMARTSIG:10 Milliliter(s) By Mouth Every 8 Hours PRN (Patient not taking: Reported on 02/26/2024)   rosuvastatin (CRESTOR) 10 MG tablet Take 10 mg by mouth once a week. (Patient not taking: Reported on 02/26/2024)   No current facility-administered medications for this visit. 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   Past Surgical History:  Procedure Laterality Date   BREAST CYST ASPIRATION Right    CESAREAN SECTION     x 2   EYE SURGERY Bilateral    Cataract extraction   LIPOMA EXCISION N/A 04/29/2023   Procedure: BACK LIPOMA EXCISION;  Surgeon: Dasie Leonor CROME, MD;  Location: [facility];  Service: General;  Laterality: N/A;   FAMILY HISTORY Family History  Problem Relation Age of Onset  Heart attack Father    Hypertension Father    Breast cancer Sister    Diabetes Sister    Hypertension Sister    Hyperlipidemia Neg Hx    Sudden death Neg Hx    SOCIAL HISTORY Social History   Tobacco Use   Smoking status: Never   Smokeless tobacco: Never  Vaping Use   Vaping status: Never Used  Substance Use Topics   Alcohol use: No   Drug use: No       OPHTHALMIC EXAM:  Not recorded    IMAGING AND PROCEDURES  Imaging and Procedures for 03/06/2024         ASSESSMENT/PLAN: No diagnosis found. 1.  2.  3.  Ophthalmic Meds Ordered this visit:  No orders of the defined types were placed in this encounter.    No follow-ups on file.  There are no Patient Instructions on file for this visit.  Explained the diagnoses, plan, and follow up with the patient and they expressed understanding.  Patient expressed understanding of the importance of proper follow up care.   This document serves as a record of services personally performed by Redell JUDITHANN Hans, MD, PhD. It was created on their behalf by Almetta Pesa, an ophthalmic technician. The creation of this record is the provider's dictation and/or activities during the visit.    Electronically signed by: Almetta Pesa, OA, 03/05/24  7:41 AM   Redell JUDITHANN Hans, M.D., Ph.D. Diseases & Surgery of the Retina and Vitreous [facility] 03/06/2024  Abbreviations: M myopia (nearsighted); A astigmatism; H hyperopia (farsighted); P presbyopia; Mrx spectacle prescription;  CTL contact lenses; OD right eye; OS left eye; OU both eyes  XT exotropia; ET esotropia; PEK punctate epithelial keratitis; PEE punctate epithelial erosions; DES dry eye syndrome; MGD meibomian gland dysfunction; ATs artificial tears; PFAT's preservative free artificial tears; NSC nuclear sclerotic cataract; PSC posterior subcapsular cataract; ERM epi-retinal membrane; PVD posterior vitreous detachment; RD retinal detachment; DM diabetes mellitus; DR diabetic retinopathy; NPDR non-proliferative diabetic retinopathy; PDR proliferative diabetic retinopathy; CSME clinically significant macular edema; DME diabetic macular edema; dbh dot blot hemorrhages; CWS cotton wool spot; POAG primary open angle glaucoma; C/D cup-to-disc ratio; HVF humphrey visual field; GVF goldmann visual field; OCT optical coherence tomography; IOP intraocular pressure; BRVO Branch retinal vein occlusion; CRVO central retinal vein occlusion;  CRAO central retinal artery occlusion; BRAO branch retinal artery occlusion; RT retinal tear; SB scleral buckle; PPV pars plana vitrectomy; VH Vitreous hemorrhage; PRP panretinal laser photocoagulation; IVK intravitreal kenalog; VMT vitreomacular traction; MH Macular hole;  NVD neovascularization of the disc; NVE neovascularization elsewhere; AREDS age related eye disease study; ARMD age related macular degeneration; POAG primary open angle glaucoma; EBMD epithelial/anterior basement membrane dystrophy; ACIOL anterior chamber intraocular lens; IOL intraocular lens; PCIOL posterior chamber intraocular lens; Phaco/IOL phacoemulsification with intraocular lens placement; PRK photorefractive keratectomy; LASIK laser assisted in situ keratomileusis; HTN hypertension; DM diabetes mellitus; COPD chronic obstructive pulmonary disease

## 2024-03-06 ENCOUNTER — Encounter (INDEPENDENT_AMBULATORY_CARE_PROVIDER_SITE_OTHER): Admitting: Ophthalmology

## 2024-03-06 DIAGNOSIS — H35372 Puckering of macula, left eye: Secondary | ICD-10-CM

## 2024-03-18 ENCOUNTER — Ambulatory Visit (HOSPITAL_BASED_OUTPATIENT_CLINIC_OR_DEPARTMENT_OTHER)
Admission: RE | Admit: 2024-03-18 | Discharge: 2024-03-18 | Disposition: A | Discharge status: Home / Self Care | Source: Ambulatory Visit | Attending: Family Medicine | Admitting: Family Medicine

## 2024-03-18 ENCOUNTER — Other Ambulatory Visit

## 2024-03-18 DIAGNOSIS — Z780 Asymptomatic menopausal state: Secondary | ICD-10-CM | POA: Insufficient documentation

## 2024-03-18 DIAGNOSIS — M810 Age-related osteoporosis without current pathological fracture: Secondary | ICD-10-CM | POA: Insufficient documentation

## 2024-03-18 DIAGNOSIS — Z13820 Encounter for screening for osteoporosis: Secondary | ICD-10-CM | POA: Insufficient documentation

## 2024-03-27 NOTE — Progress Notes (Signed)
 [facility] - Clinic Note  04/03/2024   CHIEF COMPLAINT Patient presents for No chief complaint on file.  HISTORY OF PRESENT ILLNESS: Tamara Odom is a 78 y.o. female who presents to the clinic today for:   Referring physician: Haze Kingfisher, MD [address] [place],  [place] [address]  HISTORICAL INFORMATION:  Selected notes from the MEDICAL RECORD NUMBER Referred by Dr. JAMA:  Ocular Hx- PMH-   CURRENT MEDICATIONS: No current outpatient medications on file. (Ophthalmic Drugs)   No current facility-administered medications for this visit. (Ophthalmic Drugs)   Current Outpatient Medications (Other)  Medication Sig   acetaminophen  (TYLENOL ) 650 MG CR tablet Take 325 mg by mouth daily.   alum & mag hydroxide-simeth (MAALOX MAX) 400-400-40 MG/5ML suspension Take 15 mLs by mouth every 6 (six) hours as needed for indigestion. (Patient not taking: Reported on 03/28/2023)   Biotin 5 MG CAPS 1 capsule. (Patient not taking: Reported on 02/26/2024)   cephALEXin  (KEFLEX ) 500 MG capsule Take 1 capsule (500 mg total) by mouth 2 (two) times daily. (Patient not taking: Reported on 02/26/2024)   Cyanocobalamin (VITAMIN B12) 1000 MCG TBCR 1 tablet Orally Once a day; Duration: 30 day(s) (Patient not taking: Reported on 02/26/2024)   doxycycline (MONODOX) 100 MG capsule Take 100 mg by mouth 2 (two) times daily. (Patient not taking: Reported on 02/26/2024)   Evolocumab (REPATHA) 140 MG/ML SOSY 1 ml Subcutaneous q 2 weeks; Duration: 30 day(s) (Patient not taking: Reported on 02/26/2024)   lisinopril-hydrochlorothiazide (ZESTORETIC) 20-12.5 MG tablet Take 1 tablet by mouth daily.   meloxicam  (MOBIC ) 7.5 MG tablet Take 1 tablet (7.5 mg total) by mouth once as needed for pain.   Multiple Vitamins-Minerals (HAIR SKIN & NAILS PO) Take 1 tablet by mouth daily.   omeprazole (PRILOSEC) 40 MG capsule Take 40 mg by mouth daily. (Patient not taking: Reported on 02/26/2024)   ondansetron   (ZOFRAN ) 4 MG tablet Take 1 tablet (4 mg total) by mouth every 6 (six) hours. (Patient not taking: Reported on 03/28/2023)   promethazine-dextromethorphan (PROMETHAZINE-DM) 6.25-15 MG/5ML syrup SMARTSIG:10 Milliliter(s) By Mouth Every 8 Hours PRN (Patient not taking: Reported on 02/26/2024)   rosuvastatin (CRESTOR) 10 MG tablet Take 10 mg by mouth once a week. (Patient not taking: Reported on 02/26/2024)   No current facility-administered medications for this visit. 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   Past Surgical History:  Procedure Laterality Date   BREAST CYST ASPIRATION Right    CESAREAN SECTION     x 2   EYE SURGERY Bilateral    Cataract extraction   LIPOMA EXCISION N/A 04/29/2023   Procedure: BACK LIPOMA EXCISION;  Surgeon: Dasie Leonor CROME, MD;  Location: [facility];  Service: General;  Laterality: N/A;   FAMILY HISTORY Family History  Problem Relation Age of Onset  Heart attack Father    Hypertension Father    Breast cancer Sister    Diabetes Sister    Hypertension Sister    Hyperlipidemia Neg Hx    Sudden death Neg Hx    SOCIAL HISTORY Social History   Tobacco Use   Smoking status: Never   Smokeless tobacco: Never  Vaping Use   Vaping status: Never Used  Substance Use Topics   Alcohol use: No   Drug use: No       OPHTHALMIC EXAM:  Not recorded    IMAGING AND PROCEDURES  Imaging and Procedures for 04/03/2024         ASSESSMENT/PLAN: No diagnosis found.  1.  2,3. Hypertensive retinopathy OU - discussed importance of tight BP control - monitor   4. Pseudophakia OU  - s/p CE/IOL  - IOL in good position, doing well  - monitor   5.  Dry eyes OU - recommend artificial tears and lubricating ointment as needed   Ophthalmic Meds Ordered this visit:  No orders of the defined types were placed in this encounter.    No follow-ups on file.  There are no Patient Instructions on file for this visit.  Explained the diagnoses, plan, and follow up with the patient and they expressed understanding.  Patient expressed understanding of the importance of proper follow up care.   This document serves as a record of services personally performed by Redell JUDITHANN Hans, MD, PhD. It was created on their behalf by Almetta Pesa, an ophthalmic technician. The creation of this record is the provider's dictation and/or activities during the visit.    This document serves as a record of services personally performed by Redell JUDITHANN Hans, MD, PhD. It was created on their behalf by Delon Newness COT, an ophthalmic technician. The creation of this record is the provider's dictation and/or activities during the visit.    Electronically signed by: Delon Newness COT 12.05.25  10:41 AM    Redell JUDITHANN Hans, M.D., Ph.D. Diseases & Surgery of the Retina and Vitreous [facility] 04/03/2024  Abbreviations: M myopia (nearsighted); A astigmatism; H hyperopia (farsighted); P presbyopia; Mrx spectacle prescription;  CTL contact lenses; OD right eye; OS left eye; OU both eyes  XT exotropia; ET esotropia; PEK punctate epithelial keratitis; PEE punctate epithelial erosions; DES dry eye syndrome; MGD meibomian gland dysfunction; ATs artificial tears; PFAT's preservative free artificial tears; NSC nuclear sclerotic cataract; PSC posterior subcapsular cataract; ERM epi-retinal membrane; PVD posterior  vitreous detachment; RD retinal detachment; DM diabetes mellitus; DR diabetic retinopathy; NPDR non-proliferative diabetic retinopathy; PDR proliferative diabetic retinopathy; CSME clinically significant macular edema; DME diabetic macular edema; dbh dot blot hemorrhages; CWS cotton wool spot; POAG primary open angle glaucoma; C/D cup-to-disc ratio; HVF humphrey visual field; GVF goldmann visual field; OCT optical coherence tomography; IOP intraocular pressure; BRVO Branch retinal vein occlusion; CRVO central retinal vein occlusion; CRAO central retinal artery occlusion; BRAO branch retinal artery occlusion; RT retinal tear; SB scleral buckle; PPV pars plana vitrectomy; VH Vitreous hemorrhage; PRP panretinal laser photocoagulation; IVK intravitreal kenalog; VMT vitreomacular traction; MH Macular hole;  NVD neovascularization of the disc; NVE neovascularization elsewhere; AREDS age related eye disease study; ARMD age related macular degeneration; POAG primary open angle glaucoma; EBMD epithelial/anterior basement membrane dystrophy; ACIOL anterior chamber intraocular lens; IOL intraocular lens; PCIOL posterior chamber intraocular lens; Phaco/IOL phacoemulsification with intraocular lens placement; PRK photorefractive keratectomy; LASIK laser assisted in situ keratomileusis; HTN hypertension; DM diabetes mellitus; COPD chronic obstructive  pulmonary disease

## 2024-04-03 ENCOUNTER — Encounter (INDEPENDENT_AMBULATORY_CARE_PROVIDER_SITE_OTHER): Payer: Self-pay | Admitting: Ophthalmology

## 2024-04-03 ENCOUNTER — Ambulatory Visit (INDEPENDENT_AMBULATORY_CARE_PROVIDER_SITE_OTHER): Admitting: Ophthalmology

## 2024-04-03 VITALS — BP 150/94 | HR 86

## 2024-04-03 DIAGNOSIS — Z961 Presence of intraocular lens: Secondary | ICD-10-CM

## 2024-04-03 DIAGNOSIS — H35033 Hypertensive retinopathy, bilateral: Secondary | ICD-10-CM

## 2024-04-03 DIAGNOSIS — H35363 Drusen (degenerative) of macula, bilateral: Secondary | ICD-10-CM

## 2024-04-03 DIAGNOSIS — I10 Essential (primary) hypertension: Secondary | ICD-10-CM | POA: Diagnosis not present

## 2024-04-03 DIAGNOSIS — H35372 Puckering of macula, left eye: Secondary | ICD-10-CM

## 2024-04-03 DIAGNOSIS — H26493 Other secondary cataract, bilateral: Secondary | ICD-10-CM

## 2024-07-03 ENCOUNTER — Encounter (INDEPENDENT_AMBULATORY_CARE_PROVIDER_SITE_OTHER): Admitting: Ophthalmology
